# Patient Record
Sex: Female | Born: 1985 | Race: White | Hispanic: No | Marital: Single | Smoking: Current every day smoker
Health system: Southern US, Community
[De-identification: ages and names within clinical notes are randomized; demographics above are authoritative.]

## PROBLEM LIST (undated history)

## (undated) DIAGNOSIS — J189 Pneumonia, unspecified organism: Secondary | ICD-10-CM

## (undated) DIAGNOSIS — J40 Bronchitis, not specified as acute or chronic: Secondary | ICD-10-CM

## (undated) DIAGNOSIS — E039 Hypothyroidism, unspecified: Secondary | ICD-10-CM

## (undated) DIAGNOSIS — F1190 Opioid use, unspecified, uncomplicated: Secondary | ICD-10-CM

## (undated) DIAGNOSIS — G43909 Migraine, unspecified, not intractable, without status migrainosus: Secondary | ICD-10-CM

## (undated) DIAGNOSIS — F419 Anxiety disorder, unspecified: Secondary | ICD-10-CM

## (undated) HISTORY — PX: LAPAROSCOPIC ENDOMETRIOSIS FULGURATION: SUR769

## (undated) HISTORY — PX: KNEE ARTHROSCOPY: SUR90

## (undated) HISTORY — PX: OTHER SURGICAL HISTORY: SHX169

---

## 2011-06-14 ENCOUNTER — Other Ambulatory Visit: Payer: Self-pay | Admitting: Neurosurgery

## 2011-06-16 ENCOUNTER — Encounter (HOSPITAL_COMMUNITY): Payer: Self-pay | Admitting: Pharmacy Technician

## 2011-06-21 ENCOUNTER — Encounter (HOSPITAL_COMMUNITY): Payer: Self-pay

## 2011-06-21 NOTE — Progress Notes (Signed)
Pre admit call completed. Pt denied having a cardiac cath or sleep study but did inform Nurse that in 2005 at [facility] she had a stress test. Pt stated "they thought I was having a stroke and they ran all kinds of tests on me, but I wasn't." Will attempt to request results from [facility]

## 2011-06-21 NOTE — Pre-Procedure Instructions (Signed)
20 Megan Hopkins  06/21/2011   Your procedure is scheduled on:  Wednesday June 29, 2011.  Report to [facility] at 0800 AM.  Call this number if you have problems the morning of surgery: [phone]   Remember:   Do not eat food or drink:After Midnight.    Take these medicines the morning of surgery with A SIP OF WATER: Lorazepam (Ativan) if needed for anxiety, and Oxycodone if needed for pain.   Do not wear jewelry, make-up or nail polish.  Do not wear lotions, powders, or perfumes.   Do not shave 48 hours prior to surgery.   Do not bring valuables to the hospital.  Contacts, dentures or bridgework may not be worn into surgery.  Leave suitcase in the car. After surgery it may be brought to your room.  For patients admitted to the hospital, checkout time is 11:00 AM the day of discharge.   Patients discharged the day of surgery will not be allowed to drive home.  Name and phone number of your driver:   Special Instructions: CHG Shower Use Special Wash: 1/2 bottle night before surgery and 1/2 bottle morning of surgery.   Please read over the following fact sheets that you were given: Pain Booklet, Coughing and Deep Breathing, Blood Transfusion Information, MRSA Information and Surgical Site Infection Prevention

## 2011-06-24 ENCOUNTER — Encounter (HOSPITAL_COMMUNITY)
Admission: RE | Admit: 2011-06-24 | Discharge: 2011-06-24 | Disposition: A | Discharge status: Home / Self Care | Payer: 59 | Source: Ambulatory Visit | Attending: Neurosurgery | Admitting: Neurosurgery

## 2011-06-24 HISTORY — DX: Bronchitis, not specified as acute or chronic: J40

## 2011-06-24 HISTORY — DX: Migraine, unspecified, not intractable, without status migrainosus: G43.909

## 2011-06-24 HISTORY — DX: Anxiety disorder, unspecified: F41.9

## 2011-06-24 HISTORY — DX: Pneumonia, unspecified organism: J18.9

## 2011-06-24 LAB — CBC
HCT: 42.6 % (ref 36.0–46.0)
Hemoglobin: 14.4 g/dL (ref 12.0–15.0)
RDW: 14.1 % (ref 11.5–15.5)
WBC: 7.4 10*3/uL (ref 4.0–10.5)

## 2011-06-24 LAB — TYPE AND SCREEN
ABO/RH(D): O NEG
Antibody Screen: NEGATIVE

## 2011-06-24 LAB — ABO/RH: ABO/RH(D): O NEG

## 2011-06-24 LAB — SURGICAL PCR SCREEN: Staphylococcus aureus: NEGATIVE

## 2011-06-24 LAB — HCG, SERUM, QUALITATIVE: Preg, Serum: NEGATIVE

## 2011-06-28 MED ORDER — CEFAZOLIN SODIUM 1-5 GM-% IV SOLN
1.0000 g | INTRAVENOUS | Status: DC
  Filled 2011-06-28: qty 50

## 2011-06-29 ENCOUNTER — Inpatient Hospital Stay (HOSPITAL_COMMUNITY)
Admission: RE | Admit: 2011-06-29 | Discharge: 2011-07-03 | DRG: 460 | Disposition: A | Discharge status: Home / Self Care | Payer: 59 | Source: Ambulatory Visit | Attending: Neurosurgery | Admitting: Neurosurgery

## 2011-06-29 ENCOUNTER — Ambulatory Visit (HOSPITAL_COMMUNITY): Payer: 59

## 2011-06-29 ENCOUNTER — Encounter (HOSPITAL_COMMUNITY): Payer: Self-pay | Admitting: Anesthesiology

## 2011-06-29 ENCOUNTER — Encounter (HOSPITAL_COMMUNITY)
Admission: RE | Disposition: A | Discharge status: Home / Self Care | Payer: Self-pay | Source: Ambulatory Visit | Attending: Neurosurgery

## 2011-06-29 ENCOUNTER — Ambulatory Visit (HOSPITAL_COMMUNITY): Payer: 59 | Admitting: Anesthesiology

## 2011-06-29 DIAGNOSIS — F411 Generalized anxiety disorder: Secondary | ICD-10-CM | POA: Diagnosis present

## 2011-06-29 DIAGNOSIS — M5137 Other intervertebral disc degeneration, lumbosacral region: Secondary | ICD-10-CM | POA: Diagnosis present

## 2011-06-29 DIAGNOSIS — M4310 Spondylolisthesis, site unspecified: Secondary | ICD-10-CM | POA: Diagnosis present

## 2011-06-29 DIAGNOSIS — F17200 Nicotine dependence, unspecified, uncomplicated: Secondary | ICD-10-CM | POA: Diagnosis present

## 2011-06-29 DIAGNOSIS — M47817 Spondylosis without myelopathy or radiculopathy, lumbosacral region: Principal | ICD-10-CM | POA: Diagnosis present

## 2011-06-29 DIAGNOSIS — M51379 Other intervertebral disc degeneration, lumbosacral region without mention of lumbar back pain or lower extremity pain: Secondary | ICD-10-CM | POA: Diagnosis present

## 2011-06-29 DIAGNOSIS — Z981 Arthrodesis status: Secondary | ICD-10-CM

## 2011-06-29 SURGERY — POSTERIOR LUMBAR FUSION 1 LEVEL
Anesthesia: General | Wound class: Clean

## 2011-06-29 MED ORDER — CEFAZOLIN SODIUM-DEXTROSE 2-3 GM-% IV SOLR
2.0000 g | Freq: Three times a day (TID) | INTRAVENOUS | Status: AC
  Administered 2011-06-29 – 2011-06-30 (×2): 2 g via INTRAVENOUS
  Filled 2011-06-29 (×2): qty 50

## 2011-06-29 MED ORDER — SUFENTANIL CITRATE 50 MCG/ML IV SOLN
INTRAVENOUS | Status: DC | PRN
  Administered 2011-06-29 (×15): 10 ug via INTRAVENOUS

## 2011-06-29 MED ORDER — DIAZEPAM 5 MG PO TABS
5.0000 mg | ORAL_TABLET | Freq: Four times a day (QID) | ORAL | Status: DC | PRN
  Administered 2011-06-29 – 2011-07-03 (×14): 5 mg via ORAL
  Filled 2011-06-29 (×13): qty 1

## 2011-06-29 MED ORDER — ACETAMINOPHEN 650 MG RE SUPP: 650.0000 mg | RECTAL | Status: DC | PRN

## 2011-06-29 MED ORDER — ONDANSETRON HCL 4 MG/2ML IJ SOLN
INTRAMUSCULAR | Status: DC | PRN
  Administered 2011-06-29: 4 mg via INTRAVENOUS

## 2011-06-29 MED ORDER — LIDOCAINE HCL 4 % MT SOLN
OROMUCOSAL | Status: DC | PRN
  Administered 2011-06-29: 4 mL via TOPICAL

## 2011-06-29 MED ORDER — 0.9 % SODIUM CHLORIDE (POUR BTL) OPTIME
TOPICAL | Status: DC | PRN
  Administered 2011-06-29: 1000 mL

## 2011-06-29 MED ORDER — ROCURONIUM BROMIDE 100 MG/10ML IV SOLN
INTRAVENOUS | Status: DC | PRN
  Administered 2011-06-29: 10 mg via INTRAVENOUS
  Administered 2011-06-29: 50 mg via INTRAVENOUS
  Administered 2011-06-29 (×3): 10 mg via INTRAVENOUS

## 2011-06-29 MED ORDER — DIPHENHYDRAMINE HCL 50 MG/ML IJ SOLN: 12.5000 mg | Freq: Four times a day (QID) | INTRAMUSCULAR | Status: DC | PRN

## 2011-06-29 MED ORDER — ONDANSETRON HCL 4 MG/2ML IJ SOLN: 4.0000 mg | Freq: Four times a day (QID) | INTRAMUSCULAR | Status: DC | PRN

## 2011-06-29 MED ORDER — NEOSTIGMINE METHYLSULFATE 1 MG/ML IJ SOLN
INTRAMUSCULAR | Status: DC | PRN
  Administered 2011-06-29: 3 mg via INTRAVENOUS

## 2011-06-29 MED ORDER — DIAZEPAM 5 MG PO TABS
ORAL_TABLET | ORAL | Status: AC
  Filled 2011-06-29: qty 1

## 2011-06-29 MED ORDER — HYDROMORPHONE HCL PF 1 MG/ML IJ SOLN
INTRAMUSCULAR | Status: AC
  Filled 2011-06-29: qty 1

## 2011-06-29 MED ORDER — ACETAMINOPHEN 10 MG/ML IV SOLN
INTRAVENOUS | Status: AC
  Filled 2011-06-29: qty 100

## 2011-06-29 MED ORDER — MENTHOL 3 MG MT LOZG
1.0000 | LOZENGE | OROMUCOSAL | Status: DC | PRN
  Administered 2011-06-29: 3 mg via ORAL
  Filled 2011-06-29: qty 9

## 2011-06-29 MED ORDER — SODIUM CHLORIDE 0.9 % IV SOLN
INTRAVENOUS | Status: AC
  Filled 2011-06-29: qty 500

## 2011-06-29 MED ORDER — BACITRACIN 50000 UNITS IM SOLR
INTRAMUSCULAR | Status: AC
  Filled 2011-06-29: qty 1

## 2011-06-29 MED ORDER — SODIUM CHLORIDE 0.9 % IR SOLN
Status: DC | PRN
  Administered 2011-06-29: 12:00:00

## 2011-06-29 MED ORDER — HEMOSTATIC AGENTS (NO CHARGE) OPTIME
TOPICAL | Status: DC | PRN
  Administered 2011-06-29: 1 via TOPICAL

## 2011-06-29 MED ORDER — ACETAMINOPHEN 10 MG/ML IV SOLN
INTRAVENOUS | Status: DC | PRN
  Administered 2011-06-29: 1000 mg via INTRAVENOUS

## 2011-06-29 MED ORDER — GLYCOPYRROLATE 0.2 MG/ML IJ SOLN
INTRAMUSCULAR | Status: DC | PRN
  Administered 2011-06-29: 0.4 mg via INTRAVENOUS

## 2011-06-29 MED ORDER — DEXAMETHASONE SODIUM PHOSPHATE 4 MG/ML IJ SOLN
INTRAMUSCULAR | Status: DC | PRN
  Administered 2011-06-29: 4 mg via INTRAVENOUS

## 2011-06-29 MED ORDER — DIPHENHYDRAMINE HCL 12.5 MG/5ML PO ELIX: 12.5000 mg | ORAL_SOLUTION | Freq: Four times a day (QID) | ORAL | Status: DC | PRN

## 2011-06-29 MED ORDER — NALOXONE HCL 0.4 MG/ML IJ SOLN: 0.4000 mg | INTRAMUSCULAR | Status: DC | PRN

## 2011-06-29 MED ORDER — PHENOL 1.4 % MT LIQD: 1.0000 | OROMUCOSAL | Status: DC | PRN

## 2011-06-29 MED ORDER — MORPHINE SULFATE (PF) 1 MG/ML IV SOLN
INTRAVENOUS | Status: DC
  Administered 2011-06-29 – 2011-06-30 (×5): via INTRAVENOUS
  Administered 2011-06-30: 51.79 mg via INTRAVENOUS
  Administered 2011-06-30: 17:00:00 via INTRAVENOUS
  Administered 2011-06-30: 24.99 mg via INTRAVENOUS
  Administered 2011-06-30: 02:00:00 via INTRAVENOUS
  Administered 2011-06-30: 25 mg via INTRAVENOUS
  Administered 2011-06-30 – 2011-07-01 (×3): via INTRAVENOUS
  Administered 2011-07-01: 25 mg via INTRAVENOUS
  Administered 2011-07-01: 25.03 mg via INTRAVENOUS
  Administered 2011-07-01: 21:00:00 via INTRAVENOUS
  Administered 2011-07-01: 21.28 mg via INTRAVENOUS
  Administered 2011-07-01: 18 mg via INTRAVENOUS
  Administered 2011-07-01: 27.96 mg via INTRAVENOUS
  Administered 2011-07-02: 14.91 mg via INTRAVENOUS
  Administered 2011-07-02: 18.68 mg via INTRAVENOUS
  Administered 2011-07-02: 10.5 mg via INTRAVENOUS
  Administered 2011-07-02: 01:00:00 via INTRAVENOUS
  Administered 2011-07-03: 1.5 mg via INTRAVENOUS
  Administered 2011-07-03 (×2): 3 mg via INTRAVENOUS
  Filled 2011-06-29 (×17): qty 25

## 2011-06-29 MED ORDER — THROMBIN 20000 UNITS EX KIT
PACK | CUTANEOUS | Status: DC | PRN
  Administered 2011-06-29: 20000 [IU] via TOPICAL

## 2011-06-29 MED ORDER — OXYCODONE HCL 5 MG PO TABS: 30.0000 mg | ORAL_TABLET | ORAL | Status: DC | PRN

## 2011-06-29 MED ORDER — MORPHINE SULFATE (PF) 1 MG/ML IV SOLN
INTRAVENOUS | Status: AC
  Filled 2011-06-29: qty 25

## 2011-06-29 MED ORDER — PROMETHAZINE HCL 25 MG/ML IJ SOLN: 6.2500 mg | INTRAMUSCULAR | Status: DC | PRN

## 2011-06-29 MED ORDER — OXYCODONE HCL 5 MG PO TABS
15.0000 mg | ORAL_TABLET | ORAL | Status: DC | PRN
  Administered 2011-06-29 (×2): 15 mg via ORAL
  Administered 2011-06-30: 20 mg via ORAL
  Administered 2011-06-30 (×3): 15 mg via ORAL
  Administered 2011-07-01: 20 mg via ORAL
  Administered 2011-07-01 (×2): 30 mg via ORAL
  Administered 2011-07-01: 20 mg via ORAL
  Administered 2011-07-01 – 2011-07-02 (×3): 30 mg via ORAL
  Administered 2011-07-02: 20 mg via ORAL
  Administered 2011-07-02 – 2011-07-03 (×4): 30 mg via ORAL
  Filled 2011-06-29: qty 6
  Filled 2011-06-29: qty 4
  Filled 2011-06-29: qty 3
  Filled 2011-06-29: qty 6
  Filled 2011-06-29: qty 3
  Filled 2011-06-29: qty 6
  Filled 2011-06-29: qty 2
  Filled 2011-06-29 (×2): qty 3
  Filled 2011-06-29: qty 4
  Filled 2011-06-29 (×2): qty 6
  Filled 2011-06-29: qty 3
  Filled 2011-06-29: qty 6
  Filled 2011-06-29: qty 3
  Filled 2011-06-29 (×2): qty 6
  Filled 2011-06-29 (×2): qty 3
  Filled 2011-06-29: qty 6
  Filled 2011-06-29: qty 3

## 2011-06-29 MED ORDER — CEFAZOLIN SODIUM 1-5 GM-% IV SOLN
INTRAVENOUS | Status: DC | PRN
  Administered 2011-06-29: 1 g via INTRAVENOUS

## 2011-06-29 MED ORDER — LACTATED RINGERS IV SOLN
INTRAVENOUS | Status: DC | PRN
  Administered 2011-06-29 (×3): via INTRAVENOUS

## 2011-06-29 MED ORDER — MEPERIDINE HCL 25 MG/ML IJ SOLN: 6.2500 mg | INTRAMUSCULAR | Status: DC | PRN

## 2011-06-29 MED ORDER — HYDROMORPHONE HCL PF 1 MG/ML IJ SOLN
0.2500 mg | INTRAMUSCULAR | Status: DC | PRN
  Administered 2011-06-29 (×4): 0.5 mg via INTRAVENOUS

## 2011-06-29 MED ORDER — LACTATED RINGERS IV SOLN
INTRAVENOUS | Status: DC
  Administered 2011-06-29 – 2011-07-01 (×4): via INTRAVENOUS

## 2011-06-29 MED ORDER — DIPHENHYDRAMINE HCL 25 MG PO TABS
25.0000 mg | ORAL_TABLET | Freq: Every evening | ORAL | Status: DC | PRN
  Filled 2011-06-29: qty 1

## 2011-06-29 MED ORDER — BUPIVACAINE LIPOSOME 1.3 % IJ SUSP
20.0000 mL | Freq: Once | INTRAMUSCULAR | Status: AC
  Administered 2011-06-29: 20 mL
  Filled 2011-06-29: qty 20

## 2011-06-29 MED ORDER — BUPIVACAINE-EPINEPHRINE PF 0.5-1:200000 % IJ SOLN
INTRAMUSCULAR | Status: DC | PRN
  Administered 2011-06-29: 10 mL

## 2011-06-29 MED ORDER — LIDOCAINE HCL (CARDIAC) 20 MG/ML IV SOLN
INTRAVENOUS | Status: DC | PRN
  Administered 2011-06-29: 90 mg via INTRAVENOUS

## 2011-06-29 MED ORDER — SODIUM CHLORIDE 0.9 % IJ SOLN: 9.0000 mL | INTRAMUSCULAR | Status: DC | PRN

## 2011-06-29 MED ORDER — DOCUSATE SODIUM 100 MG PO CAPS
100.0000 mg | ORAL_CAPSULE | Freq: Two times a day (BID) | ORAL | Status: DC
  Administered 2011-06-29 – 2011-07-03 (×7): 100 mg via ORAL
  Filled 2011-06-29 (×6): qty 1

## 2011-06-29 MED ORDER — HETASTARCH-ELECTROLYTES 6 % IV SOLN
INTRAVENOUS | Status: DC | PRN
  Administered 2011-06-29: 13:00:00 via INTRAVENOUS

## 2011-06-29 MED ORDER — ZOLPIDEM TARTRATE 10 MG PO TABS: 10.0000 mg | ORAL_TABLET | Freq: Every evening | ORAL | Status: DC | PRN

## 2011-06-29 MED ORDER — PROPOFOL 10 MG/ML IV EMUL
INTRAVENOUS | Status: DC | PRN
  Administered 2011-06-29: 200 mg via INTRAVENOUS

## 2011-06-29 MED ORDER — MIDAZOLAM HCL 5 MG/5ML IJ SOLN
INTRAMUSCULAR | Status: DC | PRN
  Administered 2011-06-29: 2 mg via INTRAVENOUS

## 2011-06-29 MED ORDER — ACETAMINOPHEN 325 MG PO TABS: 650.0000 mg | ORAL_TABLET | ORAL | Status: DC | PRN

## 2011-06-29 MED ORDER — BACITRACIN ZINC 500 UNIT/GM EX OINT
TOPICAL_OINTMENT | CUTANEOUS | Status: DC | PRN
  Administered 2011-06-29: 1 via TOPICAL

## 2011-06-29 MED ORDER — ONDANSETRON HCL 4 MG/2ML IJ SOLN: 4.0000 mg | INTRAMUSCULAR | Status: DC | PRN

## 2011-06-29 SURGICAL SUPPLY — 72 items
BAG DECANTER FOR FLEXI CONT (MISCELLANEOUS) ×2 IMPLANT
BENZOIN TINCTURE PRP APPL 2/3 (GAUZE/BANDAGES/DRESSINGS) ×2 IMPLANT
BLADE SURG ROTATE 9660 (MISCELLANEOUS) IMPLANT
BRUSH SCRUB EZ PLAIN DRY (MISCELLANEOUS) ×2 IMPLANT
BUR ACORN 6.0 (BURR) ×2 IMPLANT
BUR MATCHSTICK NEURO 3.0 LAGG (BURR) ×2 IMPLANT
CANISTER SUCTION 2500CC (MISCELLANEOUS) ×2 IMPLANT
CAP REVERE LOCKING (Cap) ×8 IMPLANT
CLOTH BEACON ORANGE TIMEOUT ST (SAFETY) ×2 IMPLANT
CONT SPEC 4OZ CLIKSEAL STRL BL (MISCELLANEOUS) ×4 IMPLANT
COVER BACK TABLE 24X17X13 BIG (DRAPES) IMPLANT
COVER TABLE BACK 60X90 (DRAPES) ×2 IMPLANT
DRAPE C-ARM 42X72 X-RAY (DRAPES) ×4 IMPLANT
DRAPE LAPAROTOMY 100X72X124 (DRAPES) ×2 IMPLANT
DRAPE POUCH INSTRU U-SHP 10X18 (DRAPES) ×2 IMPLANT
DRAPE PROXIMA HALF (DRAPES) ×2 IMPLANT
DRAPE SURG 17X23 STRL (DRAPES) ×8 IMPLANT
ELECT BLADE 4.0 EZ CLEAN MEGAD (MISCELLANEOUS) ×2
ELECT REM PT RETURN 9FT ADLT (ELECTROSURGICAL) ×2
ELECTRODE BLDE 4.0 EZ CLN MEGD (MISCELLANEOUS) ×1 IMPLANT
ELECTRODE REM PT RTRN 9FT ADLT (ELECTROSURGICAL) ×1 IMPLANT
GAUZE SPONGE 4X4 16PLY XRAY LF (GAUZE/BANDAGES/DRESSINGS) ×2 IMPLANT
GLOVE BIO SURGEON STRL SZ8.5 (GLOVE) ×4 IMPLANT
GLOVE BIOGEL PI IND STRL 7.0 (GLOVE) ×3 IMPLANT
GLOVE BIOGEL PI IND STRL 8 (GLOVE) ×1 IMPLANT
GLOVE BIOGEL PI INDICATOR 7.0 (GLOVE) ×3
GLOVE BIOGEL PI INDICATOR 8 (GLOVE) ×1
GLOVE ECLIPSE 7.5 STRL STRAW (GLOVE) ×2 IMPLANT
GLOVE EXAM NITRILE LRG STRL (GLOVE) IMPLANT
GLOVE EXAM NITRILE MD LF STRL (GLOVE) IMPLANT
GLOVE EXAM NITRILE XL STR (GLOVE) IMPLANT
GLOVE EXAM NITRILE XS STR PU (GLOVE) IMPLANT
GLOVE SS BIOGEL STRL SZ 7 (GLOVE) ×1 IMPLANT
GLOVE SS BIOGEL STRL SZ 8 (GLOVE) ×2 IMPLANT
GLOVE SUPERSENSE BIOGEL SZ 7 (GLOVE) ×1
GLOVE SUPERSENSE BIOGEL SZ 8 (GLOVE) ×2
GLOVE SURG SS PI 7.0 STRL IVOR (GLOVE) ×6 IMPLANT
GOWN BRE IMP SLV AUR LG STRL (GOWN DISPOSABLE) ×2 IMPLANT
GOWN BRE IMP SLV AUR XL STRL (GOWN DISPOSABLE) ×10 IMPLANT
GOWN STRL REIN 2XL LVL4 (GOWN DISPOSABLE) IMPLANT
KIT BASIN OR (CUSTOM PROCEDURE TRAY) ×2 IMPLANT
KIT ROOM TURNOVER OR (KITS) ×2 IMPLANT
MILL MEDIUM DISP (BLADE) ×2 IMPLANT
NEEDLE HYPO 21X1.5 SAFETY (NEEDLE) ×2 IMPLANT
NEEDLE HYPO 22GX1.5 SAFETY (NEEDLE) ×2 IMPLANT
NS IRRIG 1000ML POUR BTL (IV SOLUTION) ×2 IMPLANT
PACK FOAM VITOSS 10CC (Orthopedic Implant) ×2 IMPLANT
PACK LAMINECTOMY NEURO (CUSTOM PROCEDURE TRAY) ×2 IMPLANT
PAD ARMBOARD 7.5X6 YLW CONV (MISCELLANEOUS) ×6 IMPLANT
PATTIES SURGICAL .5 X1 (DISPOSABLE) IMPLANT
PUTTY 10ML ACTIFUSE ABX (Putty) ×2 IMPLANT
ROD REVERE 6.35 40MM (Rod) ×2 IMPLANT
ROD REVERE 6.35 45MM (Rod) ×2 IMPLANT
SCREW 7.5X45MM (Screw) ×4 IMPLANT
SCREW REVERE 6.35 7.5X40 (Screw) ×4 IMPLANT
SLEEVE SURGEON STRL (DRAPES) ×2 IMPLANT
SPACER SUSTAIN O 10X26 8MM (Spacer) ×4 IMPLANT
SPONGE GAUZE 4X4 12PLY (GAUZE/BANDAGES/DRESSINGS) IMPLANT
SPONGE LAP 4X18 X RAY DECT (DISPOSABLE) IMPLANT
SPONGE NEURO XRAY DETECT 1X3 (DISPOSABLE) IMPLANT
SPONGE SURGIFOAM ABS GEL 100 (HEMOSTASIS) ×2 IMPLANT
STRIP CLOSURE SKIN 1/2X4 (GAUZE/BANDAGES/DRESSINGS) IMPLANT
SUT VIC AB 1 CT1 18XBRD ANBCTR (SUTURE) ×2 IMPLANT
SUT VIC AB 1 CT1 8-18 (SUTURE) ×2
SUT VIC AB 2-0 CP2 18 (SUTURE) ×4 IMPLANT
SYR 20CC LL (SYRINGE) ×2 IMPLANT
SYR 20ML ECCENTRIC (SYRINGE) ×2 IMPLANT
TAPE CLOTH SURG 4X10 WHT LF (GAUZE/BANDAGES/DRESSINGS) ×2 IMPLANT
TOWEL OR 17X24 6PK STRL BLUE (TOWEL DISPOSABLE) ×2 IMPLANT
TOWEL OR 17X26 10 PK STRL BLUE (TOWEL DISPOSABLE) ×2 IMPLANT
TRAY FOLEY CATH 14FRSI W/METER (CATHETERS) ×2 IMPLANT
WATER STERILE IRR 1000ML POUR (IV SOLUTION) ×2 IMPLANT

## 2011-06-29 NOTE — Op Note (Signed)
Brief history: The patient is a 26 year old white female who is complaining of chronic back and leg pain consistent with a lumbar radiculopathy. She has failed medical management and workup with a lumbar MRI and lumbar x-rays. This demonstrated the patient had L5 spondylolisthesis with L5-S1 spinal stasis and spinal stenosis. I discussed the various treatment option including surgery. The patient has weighed the risks benefits and alternatives surgery decided proceed with the operation.  Preoperative diagnosis: L5-S1 spondylolisthesis, spondylolysis, Degenerative disc disease, spinal stenosis/bilateral neuroforaminal stenosis affecting the L5 and S1 nerve roots; lumbago; lumbar radiculopathy  Postoperative diagnosis: L5-S1 spondylolisthesis, spondylolysis, Degenerative disc disease, spinal stenosis; lumbago; lumbar radiculopathy  Procedure: Complete L5 laminectomy with bilateral foraminotomies to decompress the bilateral L5 and S1 nerve roots(the work required to do this was in addition to the work required to do the posterior lumbar interbody fusion because of the patient's spinal stenosis, facet arthropathy. Etc. requiring a wide decompression of the nerve roots.); L5-S1 posterior lumbar interbody fusion with local morselized autograft bone and Actifusebone graft extender; insertion of interbody prosthesis at L5-S1 (globus peek interbody prosthesis); posterior nonsegmental instrumentation from L5 to S1 with globus titanium pedicle screws and rods; posterior lateral arthrodesis at L5-S1 with local morselized autograft bone and Vitoss bone graft extender.  Surgeon: Dr. Delma Officer  Asst.: Dr. Shirlean Kelly  Anesthesia: Gen. endotracheal  Estimated blood loss: 200 cc  Drains: None  Locations: None  Description of procedure: The patient was brought to the operating room by the anesthesia team. General endotracheal anesthesia was induced. The patient was turned to the prone position on the Wilson  frame. The patient's lumbosacral region was then prepared with Betadine scrub and Betadine solution. Sterile drapes were applied.  I then injected the area to be incised with Marcaine with epinephrine solution. I then used the scalpel to make a linear midline incision over the L5-S1 interspace. I then used electrocautery to perform a bilateral subperiosteal dissection exposing the spinous process and lamina of L5 and the upper sacrum. We then obtained intraoperative radiograph to confirm our location. We then inserted the Verstrac retractor to provide exposure.  I began the decompression by incising the interspinous ligament at L4-5 and L5-S1 with a scalpel. I then used Leksell were unsure to remove the lamina at L5 as well as a mild lateral L5 superior facets. We then used the Kerrison punches to widen the laminotomy and removed the ligamentum flavum at L4-5 and L5-S1. We used the Kerrison punches to remove the medial facets at 5 S1. We performed wide foraminotomies about the bilateral L5 and S1 nerve roots completing the decompression.  We now turned our attention to the posterior lumbar interbody fusion. I used a scalpel to incise the intervertebral disc at L5-S1. I then performed a partial intervertebral discectomy at L5-S1 using the pituitary forceps. We prepared the vertebral endplates at 5 S1 for the fusion by removing the soft tissues with the curettes. We then used the trial spacers to pick the appropriate sized interbody prosthesis. We prefilled his prosthesis with a combination of local morselized autograft bone that we obtained during the decompression as well as Actifuse bone graft extender. We inserted the prefilled prosthesis into the interspace at L5-S1. There was a good snug fit of the prosthesis in the interspace. We then filled and the remainder of the intervertebral disc space with local morselized autograft bone and Actifuse. This completed the posterior lumbar interbody arthrodesis.  We  now turned attention to the instrumentation. Under fluoroscopic guidance  we cannulated the bilateral L5 and S1 pedicles with the bone probe. We then removed the bone probe. He then tapped the pedicle with a 6.5 millimeter tap. We then removed the tap. We probed inside the tapped pedicle with a ball probe to rule out cortical breaches. We then inserted a 6.5 x 45 and 40 millimeter pedicle screw into the L5 and S1 pedicles bilaterally under fluoroscopic guidance. We then palpated along the medial aspect of the pedicles to rule out cortical breaches. There were none. The nerve roots were not injured. We then connected the unilateral pedicle screws with a lordotic rod. We compressed the construct and secured the rod in place with the caps. We then tightened the caps appropriately. This completed the instrumentation from L5-S1.  We now turned our attention to the posterior lateral arthrodesis at L5-S1. We used the high-speed drill to decorticate the remainder of the facets, pars, transverse process at L5 and S1. We then applied a combination of local morselized autograft bone and Vitoss bone graft extender over these decorticated posterior lateral structures. This completed the posterior lateral arthrodesis.  We then obtained hemostasis using bipolar electrocautery. We irrigated the wound out with bacitracin solution. We inspected the thecal sac and nerve roots and noted they were well decompressed. We then removed the retractor. We reapproximated patient's thoracolumbar fascia with interrupted #1 Vicryl suture. We reapproximated patient's subcutaneous tissue with interrupted 2-0 Vicryl suture. The reapproximated patient's skin with Steri-Strips and benzoin. The wound was then coated with bacitracin ointment. A sterile dressing was applied. The drapes were removed. The patient was subsequently returned to the supine position where they were extubated by the anesthesia team. He was then transported to the post  anesthesia care unit in stable condition. All sponge instrument and needle counts were correct at the end of this case.

## 2011-06-29 NOTE — Preoperative (Signed)
Beta Blockers   Reason not to administer Beta Blockers:Not Applicable 

## 2011-06-29 NOTE — Progress Notes (Signed)
Morphine PCA used 22.45 before new syringe placed.

## 2011-06-29 NOTE — Progress Notes (Signed)
Patient ID: Megan Hopkins, female   DOB: 01/13/1985, 26 y.o.   MRN: [id number] Subjective:  The patient is alert. Her back is sore.  Objective: Vital signs in last 24 hours: Temp:  [98.1 F (36.7 C)] 98.1 F (36.7 C) (06/19 0815) Pulse Rate:  [104] 104  (06/19 0815) Resp:  [18] 18  (06/19 0815) BP: (107)/(69) 107/69 mmHg (06/19 0815) SpO2:  [99 %] 99 % (06/19 0815)  Intake/Output from previous day:   Intake/Output this shift: Total I/O In: 3000 [I.V.:2500; IV Piggyback:500] Out: 550 [Urine:400; Blood:150]  Physical exam the patient is moving all 4 extremities well. Her dressing is clean and dry.  Lab Results: No results found for this basename: WBC:2,HGB:2,HCT:2,PLT:2 in the last 72 hours BMET No results found for this basename: NA:2,K:2,CL:2,CO2:2,GLUCOSE:2,BUN:2,CREATININE:2,CALCIUM:2 in the last 72 hours  Studies/Results: Dg Lumbar Spine 1 View  06/29/2011  *RADIOLOGY REPORT*  Clinical Data: 26 year old female undergoing lumbar spine surgery.  LUMBAR SPINE - 1 VIEW  Comparison: None.  Findings: Portable cross-table lateral intraoperative view of the lumbar spine labeled film #[id number] hours.  Normal lumbar segmentation presumed.  Anterolisthesis of L5 on S1. Surgical probe directed at the L5 pedicle level.  IMPRESSION: Intraoperative localization at L5 assuming normal lumbar segmentation.  Original Report Authenticated By: [name], M.D.    Assessment/Plan: Patient is doing well.  LOS: 0 days     Vane Yapp D 06/29/2011, 3:03 PM

## 2011-06-29 NOTE — Transfer of Care (Signed)
Immediate Anesthesia Transfer of Care Note  Patient: Megan Hopkins  Procedure(s) Performed: Procedure(s) (LRB): POSTERIOR LUMBAR FUSION 1 LEVEL (N/A)  Patient Location: PACU  Anesthesia Type: General  Level of Consciousness: awake, alert  and patient cooperative  Airway & Oxygen Therapy: Patient Spontanous Breathing and Patient connected to nasal cannula oxygen  Post-op Assessment: Report given to PACU RN and Post -op Vital signs reviewed and stable  Post vital signs: Reviewed  Complications: No apparent anesthesia complications

## 2011-06-29 NOTE — Anesthesia Postprocedure Evaluation (Signed)
  Anesthesia Post-op Note  Patient: Megan Hopkins  Procedure(s) Performed: Procedure(s) (LRB): POSTERIOR LUMBAR FUSION 1 LEVEL (N/A)  Patient Location: PACU  Anesthesia Type: General  Level of Consciousness: awake  Airway and Oxygen Therapy: Patient Spontanous Breathing  Post-op Pain: moderate  Post-op Assessment: Post-op Vital signs reviewed  Post-op Vital Signs: stable  Complications: No apparent anesthesia complications

## 2011-06-29 NOTE — H&P (Signed)
Subjective: The patient is a 26 year old white female who's had chronic back and leg pain consistent with a lumbar radiculopathy. She has failed medical management and was worked up with a lumbar MRI. This demonstrated patient has spondylolisthesis and foraminal stenosis at L5-S1. I discussed the various treatment options with the patient including surgery. The patient has weighed the risks, benefits, and alternatives surgery decided proceed with at L5-S1 decompression instrumentation and fusion.   Past Medical History  Diagnosis Date  . Pneumonia   . Bronchitis     hx of  . Anxiety   . Migraine     hx of    Past Surgical History  Procedure Date  . Cervical colon biospy   . Knee arthroscopy     left  . Laparoscopic endometriosis fulguration     Allergies  Allergen Reactions  . Other Anaphylaxis    Horseradish.  . Bee Venom Hives and Swelling  . Doxycycline Hives and Nausea And Vomiting  . Calamine Hives    History  Substance Use Topics  . Smoking status: Current Everyday Smoker -- 0.2 packs/day for 8 years  . Smokeless tobacco: Not on file  . Alcohol Use: Yes     occassional    No family history on file. Prior to Admission medications   Medication Sig Start Date End Date Taking? Authorizing Provider  diphenhydrAMINE (BENADRYL) 25 MG tablet Take 25 mg by mouth at bedtime as needed. For sleep.   Yes Historical Provider, MD  LORazepam (ATIVAN) 0.5 MG tablet Take 0.5 mg by mouth as needed.   Yes Historical Provider, MD  oxyCODONE (ROXICODONE) 15 MG immediate release tablet Take 15 mg by mouth every 4 (four) hours as needed. For back pain.   Yes Historical Provider, MD     Review of Systems  Positive ROS: As above  All other systems have been reviewed and were otherwise negative with the exception of those mentioned in the HPI and as above.  Objective: Vital signs in last 24 hours: Temp:  [98.1 F (36.7 C)] 98.1 F (36.7 C) (06/19 0815) Pulse Rate:  [104] 104  (06/19  0815) Resp:  [18] 18  (06/19 0815) BP: (107)/(69) 107/69 mmHg (06/19 0815) SpO2:  [99 %] 99 % (06/19 0815)  General Appearance: Alert, cooperative, no distress, appears stated age Head: Normocephalic, without obvious abnormality, atraumatic Eyes: PERRL, conjunctiva/corneas clear, EOM's intact, fundi benign, both eyes      Ears: Normal TM's and external ear canals, both ears Throat: Lips, mucosa, and tongue normal; teeth and gums normal Neck: Supple, symmetrical, trachea midline, no adenopathy; thyroid: No enlargement/tenderness/nodules; no carotid bruit or JVD Back: Symmetric, no curvature, ROM normal, no CVA tenderness Lungs: Clear to auscultation bilaterally, respirations unlabored Heart: Regular rate and rhythm, S1 and S2 normal, no murmur, rub or gallop Abdomen: Soft, non-tender, bowel sounds active all four quadrants, no masses, no organomegaly Extremities: Extremities normal, atraumatic, no cyanosis or edema Pulses: 2+ and symmetric all extremities Skin: Skin color, texture, turgor normal, no rashes or lesions  NEUROLOGIC:   Mental status: alert and oriented, no aphasia, good attention span, Fund of knowledge/ memory ok Motor Exam - grossly normal Sensory Exam - grossly normal Reflexes: Symmetric Coordination - grossly normal Gait - grossly normal Balance - grossly normal Cranial Nerves: I: smell Not tested  II: visual acuity  OS: Normal    OD: Normal   II: visual fields Full to confrontation  II: pupils Equal, round, reactive to light  III,VII: ptosis None  III,IV,VI: extraocular muscles  Full ROM  V: mastication Normal  V: facial light touch sensation  Normal  V,VII: corneal reflex  Present  VII: facial muscle function - upper  Normal  VII: facial muscle function - lower Normal  VIII: hearing Not tested  IX: soft palate elevation  Normal  IX,X: gag reflex Present  XI: trapezius strength  5/5  XI: sternocleidomastoid strength 5/5  XI: neck flexion strength  5/5    XII: tongue strength  Normal    Data Review Lab Results  Component Value Date   WBC 7.4 06/24/2011   HGB 14.4 06/24/2011   HCT 42.6 06/24/2011   MCV 86.6 06/24/2011   PLT 198 06/24/2011   No results found for this basename: NA, K, CL, CO2, BUN, creatinine, glucose   No results found for this basename: INR, PROTIME    Assessment/Plan: L5-S1 spondylolisthesis, spondylolysis, lumbago, lumbar radiculopathy, stenosis: I discussed situation with the patient. I reviewed her MR scan with her and pointed out the abnormalities. We have discussed the various treatment options including a L5-S1 decompression instrumentation and fusion. I've shown her surgical models. We have discussed the risks, benefits, alternatives and likelihood of achieving our goals with surgery. I've answered all her questions. She has decided proceed with the operation.   Jabreel Chimento D 06/29/2011 10:26 AM

## 2011-06-29 NOTE — Anesthesia Preprocedure Evaluation (Addendum)
Anesthesia Evaluation  Patient identified by MRN, date of birth, ID band Patient awake    Reviewed: Allergy & Precautions, H&P , NPO status , Patient's Chart, lab work & pertinent test results  History of Anesthesia Complications Negative for: history of anesthetic complications  Airway Mallampati: I TM Distance: >3 FB Neck ROM: Full    Dental  (+) Teeth Intact and Dental Advisory Given   Pulmonary neg pulmonary ROS, Current Smoker,  breath sounds clear to auscultation        Cardiovascular negative cardio ROS  Rhythm:Regular Rate:Normal     Neuro/Psych Anxiety    GI/Hepatic negative GI ROS, Neg liver ROS,   Endo/Other  negative endocrine ROS  Renal/GU negative Renal ROS     Musculoskeletal   Abdominal   Peds  Hematology negative hematology ROS (+)   Anesthesia Other Findings   Reproductive/Obstetrics                          Anesthesia Physical Anesthesia Plan  ASA: I  Anesthesia Plan: General   Post-op Pain Management:    Induction: Intravenous  Airway Management Planned: Oral ETT  Additional Equipment:   Intra-op Plan:   Post-operative Plan: Extubation in OR  Informed Consent: I have reviewed the patients History and Physical, chart, labs and discussed the procedure including the risks, benefits and alternatives for the proposed anesthesia with the patient or authorized representative who has indicated his/her understanding and acceptance.   Dental advisory given  Plan Discussed with: CRNA and Surgeon  Anesthesia Plan Comments:         Anesthesia Quick Evaluation

## 2011-06-30 ENCOUNTER — Encounter (HOSPITAL_COMMUNITY): Payer: Self-pay | Admitting: *Deleted

## 2011-06-30 LAB — BASIC METABOLIC PANEL
BUN: 8 mg/dL (ref 6–23)
Chloride: 106 mEq/L (ref 96–112)
Creatinine, Ser: 0.79 mg/dL (ref 0.50–1.10)
Glucose, Bld: 96 mg/dL (ref 70–99)
Potassium: 3.5 mEq/L (ref 3.5–5.1)

## 2011-06-30 LAB — CBC
HCT: 36.4 % (ref 36.0–46.0)
Hemoglobin: 12.2 g/dL (ref 12.0–15.0)
MCV: 85.2 fL (ref 78.0–100.0)
WBC: 12.9 10*3/uL — ABNORMAL HIGH (ref 4.0–10.5)

## 2011-06-30 MED ORDER — ACETAMINOPHEN 10 MG/ML IV SOLN
1000.0000 mg | Freq: Four times a day (QID) | INTRAVENOUS | Status: AC
  Administered 2011-06-30 – 2011-07-01 (×3): 1000 mg via INTRAVENOUS
  Filled 2011-06-30 (×4): qty 100

## 2011-06-30 MED ORDER — KETOROLAC TROMETHAMINE 30 MG/ML IJ SOLN
30.0000 mg | Freq: Four times a day (QID) | INTRAMUSCULAR | Status: DC | PRN
  Administered 2011-07-01 – 2011-07-03 (×9): 30 mg via INTRAVENOUS
  Filled 2011-06-30 (×9): qty 1

## 2011-06-30 NOTE — Evaluation (Signed)
Physical Therapy Evaluation Patient Details Name: Megan Hopkins MRN: [id number] DOB: 1985/04/11 Today's Date: 06/30/2011 Time: 4098-1191 PT Time Calculation (min): 16 min  PT Assessment / Plan / Recommendation Clinical Impression  Pt s/p PLIF L5-S1. Pt in excrutiating 10/10 pain with all mobility limiting progress. Pt will benefit from skilled PT in the acute care setting in order to maximize functional mobility.    PT Assessment  Patient needs continued PT services    Follow Up Recommendations  Home health PT;Supervision/Assistance - 24 hour    Barriers to Discharge        lEquipment Recommendations  Rolling walker with 5" wheels    Recommendations for Other Services     Frequency Min 5X/week    Precautions / Restrictions Precautions Precautions: Back Precaution Booklet Issued: Yes (comment) Precaution Comments: pt educated on 3/3 back precautions Required Braces or Orthoses: Spinal Brace Spinal Brace: Applied in sitting position;Lumbar corset Restrictions Weight Bearing Restrictions: No         Mobility  Bed Mobility Bed Mobility: Rolling Left;Left Sidelying to Sit;Sit to Sidelying Left;Sitting - Scoot to Edge of Bed Rolling Left: 4: Min assist;With rail Left Sidelying to Sit: 3: Mod assist;With rails Sitting - Scoot to Edge of Bed: 5: Supervision Sit to Sidelying Left: 4: Min assist;With rail Details for Bed Mobility Assistance: VC for proper sequencing to maintain back precautions during transfers. Pt extremely labile during transfers, difficult to calm down. Increased time to complete  Transfers Transfers: Not assessed Ambulation/Gait Ambulation/Gait Assistance: Not tested (comment)    Exercises     PT Diagnosis: Difficulty walking;Acute pain  PT Problem List: Decreased strength;Decreased activity tolerance;Decreased mobility;Decreased knowledge of use of DME;Decreased safety awareness;Decreased knowledge of precautions;Pain PT Treatment Interventions: DME  instruction;Gait training;Stair training;Functional mobility training;Therapeutic activities;Patient/family education   PT Goals Acute Rehab PT Goals PT Goal Formulation: With patient/family Time For Goal Achievement: 07/07/11 Potential to Achieve Goals: Fair (pending pain ) Pt will Roll Supine to Right Side: with modified independence PT Goal: Rolling Supine to Right Side - Progress: Goal set today Pt will Roll Supine to Left Side: with modified independence PT Goal: Rolling Supine to Left Side - Progress: Goal set today Pt will go Supine/Side to Sit: with modified independence PT Goal: Supine/Side to Sit - Progress: Goal set today Pt will go Sit to Supine/Side: with modified independence PT Goal: Sit to Supine/Side - Progress: Goal set today Pt will go Sit to Stand: with supervision PT Goal: Sit to Stand - Progress: Goal set today Pt will go Stand to Sit: with supervision PT Goal: Stand to Sit - Progress: Goal set today Pt will Transfer Bed to Chair/Chair to Bed: with supervision PT Transfer Goal: Bed to Chair/Chair to Bed - Progress: Goal set today Pt will Ambulate: 51 - 150 feet;with supervision;with least restrictive assistive device PT Goal: Ambulate - Progress: Goal set today Pt will Go Up / Down Stairs: 3-5 stairs;with min assist;with least restrictive assistive device PT Goal: Up/Down Stairs - Progress: Goal set today  Visit Information  Last PT Received On: 06/30/11 Assistance Needed: +1    Subjective Data      Prior Functioning  Home Living Lives With: Family Available Help at Discharge: Family;Available 24 hours/day Type of Home: House Home Access: Stairs to enter [employer] of Steps: 3 Entrance Stairs-Rails: None Home Layout: One level Bathroom Shower/Tub: Forensic scientist: Standard Bathroom Accessibility: Yes How Accessible: Accessible via walker Home Adaptive Equipment: None Prior Function Level of Independence:  Independent Able to Take Stairs?: Yes Driving: Yes Vocation: Unemployed Communication Communication: No difficulties Dominant Hand: Right    Cognition  Overall Cognitive Status: Appears within functional limits for tasks assessed/performed Arousal/Alertness: Awake/alert Orientation Level: Appears intact for tasks assessed Behavior During Session: [facility] for tasks performed    Extremity/Trunk Assessment Right Lower Extremity Assessment RLE ROM/Strength/Tone: Unable to fully assess;Due to pain RLE Sensation: WFL - Light Touch Left Lower Extremity Assessment LLE ROM/Strength/Tone: Unable to fully assess;Due to pain LLE Sensation: WFL - Light Touch   Balance    End of Session PT - End of Session Activity Tolerance: Patient limited by pain Patient left: in bed;with call bell/phone within reach;with family/visitor present Nurse Communication: Mobility status   Milana Kidney 06/30/2011, 11:41 AM  06/30/2011 Milana Kidney DPT PAGER: [phone] OFFICE: [phone]

## 2011-06-30 NOTE — Progress Notes (Signed)
Pt foley removed at 530. At that time, we attempted to ambulate. Pt's pain was severe, 10/10. Pt was not able to ambulate or sit on side of bed.

## 2011-06-30 NOTE — Clinical Social Work Note (Signed)
CSW received a consult for SNF. PT is recommending home health PT. RNCM is aware and following. CSW is signing off a no further needs identified. Please reconsult if a need arises prior to discharge.   Dede Query, MSW, Theresia Majors [phone]

## 2011-06-30 NOTE — Progress Notes (Signed)
51.79 mg cleared from PCA pump. Pump showed last clear time was 05:25 on 06/30/2011

## 2011-06-30 NOTE — Progress Notes (Signed)
Patient ID: Megan Hopkins, female   DOB: 1985-08-14, 27 y.o.   MRN: [id number] Subjective:  The patient complains of back pain. She says she's not getting adequate relief with the IV morphine.  Objective: Vital signs in last 24 hours: Temp:  [97 F (36.1 C)-98.6 F (37 C)] 97.8 F (36.6 C) (06/20 1012) Pulse Rate:  [73-113] 99  (06/20 1012) Resp:  [11-27] 20  (06/20 1012) BP: (86-125)/(48-71) 111/67 mmHg (06/20 1012) SpO2:  [97 %-100 %] 100 % (06/20 1012) FiO2 (%):  [2 %] 2 % (06/19 2020) Weight:  [76.204 kg (168 lb)] 76.204 kg (168 lb) (06/19 2354)  Intake/Output from previous day: 06/19 0701 - 06/20 0700 In: 3950 [I.V.:3450; IV Piggyback:500] Out: 3500 [Urine:3350; Blood:150] Intake/Output this shift:    Physical exam the patient is alert and oriented. She is moving all 4 extremities well.  Lab Results:  [facility] 06/30/11 0645  WBC 12.9*  HGB 12.2  HCT 36.4  PLT 179   BMET  Basename 06/30/11 0645  NA 139  K 3.5  CL 106  CO2 25  GLUCOSE 96  BUN 8  CREATININE 0.79  CALCIUM 8.4    Studies/Results: Dg Lumbar Spine 2-3 Views  06/29/2011  *RADIOLOGY REPORT*  Clinical Data: 26 year old female undergoing lumbar surgery.  DG C-ARM 1-60 MIN, LUMBAR SPINE - 2-3 VIEW  Technique: Two intraoperative fluoroscopic views of the lumbosacral junction.  Fluoroscopy time of 0.5 minutes was utilized.  Comparison:  1155 hours same day.  Findings: Frontal and lateral fluoroscopic spot views demonstrate transpedicular hardware placement at L5 and S1 and interbody hardware placement at the same level.  IMPRESSION: Posterior and interbody fusion at L5-S1 under way.  Original Report Authenticated By: [name], M.Hopkins.   Dg Lumbar Spine 1 View  06/29/2011  *RADIOLOGY REPORT*  Clinical Data: 26 year old female undergoing lumbar spine surgery.  LUMBAR SPINE - 1 VIEW  Comparison: None.  Findings: Portable cross-table lateral intraoperative view of the lumbar spine labeled film #[id number]  hours.  Normal lumbar segmentation presumed.  Anterolisthesis of L5 on S1. Surgical probe directed at the L5 pedicle level.  IMPRESSION: Intraoperative localization at L5 assuming normal lumbar segmentation.  Original Report Authenticated By: [name], M.Hopkins.   Dg C-arm 1-60 Min  06/29/2011  *RADIOLOGY REPORT*  Clinical Data: 26 year old female undergoing lumbar surgery.  DG C-ARM 1-60 MIN, LUMBAR SPINE - 2-3 VIEW  Technique: Two intraoperative fluoroscopic views of the lumbosacral junction.  Fluoroscopy time of 0.5 minutes was utilized.  Comparison:  1155 hours same day.  Findings: Frontal and lateral fluoroscopic spot views demonstrate transpedicular hardware placement at L5 and S1 and interbody hardware placement at the same level.  IMPRESSION: Posterior and interbody fusion at L5-S1 under way.  Original Report Authenticated By: [name], M.Hopkins.    Assessment/Plan: Postop day #1: I will change her PCA to Dilaudid. I'll add Toradol and IV Tylenol. We will mobilize her with PT and OT.  LOS: 1 day     Megan Hopkins 06/30/2011, 10:26 AM

## 2011-06-30 NOTE — Progress Notes (Signed)
OT Cancellation Note  Treatment cancelled today due to medical issues with patient which prohibited therapy - pt. With pain 10/10 all morning.  Pt. With limited participation with PT, and spoke with RN.  Pt. Just starting to settle down, and RN requested OT hold eval today, and re-attempt tomorrow  Boykin Reaper [phone] 06/30/2011, 12:38 PM

## 2011-06-30 NOTE — Progress Notes (Signed)
49.4 of Morphine PCA cleared from pump.

## 2011-06-30 NOTE — Progress Notes (Signed)
11.49 mg of morphine cleared from PCA

## 2011-07-01 MED ORDER — OXYCODONE HCL 40 MG PO TB12: 40.0000 mg | ORAL_TABLET | Freq: Two times a day (BID) | ORAL | Status: DC

## 2011-07-01 MED ORDER — OXYCODONE HCL 40 MG PO TB12
40.0000 mg | ORAL_TABLET | Freq: Two times a day (BID) | ORAL | Status: DC
  Administered 2011-07-01 – 2011-07-03 (×5): 40 mg via ORAL
  Filled 2011-07-01 (×5): qty 1

## 2011-07-01 MED ORDER — OXYCODONE HCL 15 MG PO TABS: 15.0000 mg | ORAL_TABLET | ORAL | Status: AC | PRN

## 2011-07-01 MED ORDER — DIAZEPAM 5 MG PO TABS: 5.0000 mg | ORAL_TABLET | Freq: Four times a day (QID) | ORAL | Status: AC | PRN

## 2011-07-01 MED ORDER — DSS 100 MG PO CAPS: 100.0000 mg | ORAL_CAPSULE | Freq: Two times a day (BID) | ORAL | Status: AC

## 2011-07-01 MED FILL — Sodium Chloride IV Soln 0.9%: INTRAVENOUS | Qty: 1000 | Status: AC

## 2011-07-01 MED FILL — Heparin Sodium (Porcine) Inj 1000 Unit/ML: INTRAMUSCULAR | Qty: 30 | Status: AC

## 2011-07-01 NOTE — Progress Notes (Signed)
Patient ID: Megan Hopkins, female   DOB: [birth year], 26 y.o.   MRN: [id number] Subjective:  The patient is alert and pleasant. She looks much better today. She does continue to complain of back pain is not getting adequate control with her present regimen.  Objective: Vital signs in last 24 hours: Temp:  [97.8 F (36.6 C)-100.2 F (37.9 C)] 99.7 F (37.6 C) (06/21 0513) Pulse Rate:  [93-99] 94  (06/21 0513) Resp:  [18-20] 18  (06/21 0513) BP: (99-137)/(63-83) 100/63 mmHg (06/21 0513) SpO2:  [96 %-100 %] 96 % (06/21 0513)  Intake/Output from previous day: 06/20 0701 - 06/21 0700 In: 240 [P.O.:240] Out: -  Intake/Output this shift:    Physical exam the patient is alert and oriented. She is moving all 4 extremities well.  Lab Results:  [name] 06/30/11 0645  WBC 12.9*  HGB 12.2  HCT 36.4  PLT 179   BMET  Basename 06/30/11 0645  NA 139  K 3.5  CL 106  CO2 25  GLUCOSE 96  BUN 8  CREATININE 0.79  CALCIUM 8.4    Studies/Results: Dg Lumbar Spine 2-3 Views  06/29/2011  *RADIOLOGY REPORT*  Clinical Data: 26 year old female undergoing lumbar surgery.  DG C-ARM 1-60 MIN, LUMBAR SPINE - 2-3 VIEW  Technique: Two intraoperative fluoroscopic views of the lumbosacral junction.  Fluoroscopy time of 0.5 minutes was utilized.  Comparison:  1155 hours same day.  Findings: Frontal and lateral fluoroscopic spot views demonstrate transpedicular hardware placement at L5 and S1 and interbody hardware placement at the same level.  IMPRESSION: Posterior and interbody fusion at L5-S1 under way.  Original Report Authenticated By: [name], M.D.   Dg Lumbar Spine 1 View  06/29/2011  *RADIOLOGY REPORT*  Clinical Data: 26 year old female undergoing lumbar spine surgery.  LUMBAR SPINE - 1 VIEW  Comparison: None.  Findings: Portable cross-table lateral intraoperative view of the lumbar spine labeled film #[id number] hours.  Normal lumbar segmentation presumed.  Anterolisthesis of L5 on S1.  Surgical probe directed at the L5 pedicle level.  IMPRESSION: Intraoperative localization at L5 assuming normal lumbar segmentation.  Original Report Authenticated By: [name], M.D.   Dg C-arm 1-60 Min  06/29/2011  *RADIOLOGY REPORT*  Clinical Data: 26 year old female undergoing lumbar surgery.  DG C-ARM 1-60 MIN, LUMBAR SPINE - 2-3 VIEW  Technique: Two intraoperative fluoroscopic views of the lumbosacral junction.  Fluoroscopy time of 0.5 minutes was utilized.  Comparison:  1155 hours same day.  Findings: Frontal and lateral fluoroscopic spot views demonstrate transpedicular hardware placement at L5 and S1 and interbody hardware placement at the same level.  IMPRESSION: Posterior and interbody fusion at L5-S1 under way.  Original Report Authenticated By: [name], M.D.    Assessment/Plan: Postop day #2: The patient is doing better today. The patient has a history of taking large amounts of oxycodone for a long time. She therefore likely has a combination of a low tolerance for pain and a high tolerance for pain medications. I will therefore add OxyContin. We will plan to discontinue her PCA pump tomorrow. She will be likely discharged home on Sunday. I given the patient will discharge instructions and answered all her questions. Her prescriptions are in the chart.  LOS: 2 days     Megan Hopkins D 07/01/2011, 7:17 AM

## 2011-07-01 NOTE — Progress Notes (Signed)
Physical Therapy Treatment Patient Details Name: Megan Hopkins MRN: [id number] DOB: 09-Dec-1985 Today's Date: 07/01/2011 Time: 0922-1000 PT Time Calculation (min): 38 min  PT Assessment / Plan / Recommendation Comments on Treatment Session  Pt progressing well, she was able to tolerate mobility this session. Pt still limited by pain, atlhough understands the need for mobility to decrease pain. Will attempt RW next session for patients comfort.    Follow Up Recommendations  Home health PT;Supervision/Assistance - 24 hour    Barriers to Discharge        Equipment Recommendations  Rolling walker with 5" wheels    Recommendations for Other Services    Frequency Min 5X/week   Plan Discharge plan remains appropriate;Frequency remains appropriate    Precautions / Restrictions Precautions Precautions: Back Precaution Booklet Issued: Yes (comment) Precaution Comments: pt able to recall 2/3 back precautions. Reeducated Required Braces or Orthoses: Spinal Brace Spinal Brace: Applied in sitting position;Lumbar corset (required max assist) Restrictions Weight Bearing Restrictions: No       Mobility  Bed Mobility Bed Mobility: Rolling Left;Left Sidelying to Sit;Sit to Sidelying Left;Sitting - Scoot to Edge of Bed Rolling Left: 4: Min assist;With rail Left Sidelying to Sit: 4: Min assist;With rails Sitting - Scoot to Edge of Bed: 5: Supervision Details for Bed Mobility Assistance: VC for proper sequencing. Min assist through trunk for stability Transfers Transfers: Sit to Stand;Stand to Sit Sit to Stand: 4: Min assist;With upper extremity assist;From bed;From chair/3-in-1 Stand to Sit: 4: Min assist;With upper extremity assist;4: Min guard;To chair/3-in-1 Details for Transfer Assistance: VC for hand placement. Min assist for stability and to assist with anterior translation Ambulation/Gait Ambulation/Gait Assistance: 4: Min assist Ambulation Distance (Feet): 20 Feet (to/from  bathroom) Assistive device: 1 person hand held assist Ambulation/Gait Assistance Details: VC for sequencing and safety. Will attempt RW next session for comfort Gait Pattern: Step-to pattern;Decreased stride length;Decreased hip/knee flexion - right;Decreased hip/knee flexion - left;Antalgic Gait velocity: decreased gait speed    Exercises     PT Diagnosis:    PT Problem List:   PT Treatment Interventions:     PT Goals Acute Rehab PT Goals PT Goal Formulation: With patient/family PT Goal: Rolling Supine to Right Side - Progress: Progressing toward goal PT Goal: Rolling Supine to Left Side - Progress: Progressing toward goal PT Goal: Supine/Side to Sit - Progress: Progressing toward goal PT Goal: Sit to Supine/Side - Progress: Progressing toward goal PT Goal: Sit to Stand - Progress: Progressing toward goal PT Goal: Stand to Sit - Progress: Progressing toward goal PT Transfer Goal: Bed to Chair/Chair to Bed - Progress: Progressing toward goal PT Goal: Ambulate - Progress: Progressing toward goal  Visit Information  Last PT Received On: 07/01/11 Assistance Needed: +1 PT/OT Co-Evaluation/Treatment: Yes    Subjective Data      Cognition  Overall Cognitive Status: Appears within functional limits for tasks assessed/performed Arousal/Alertness: Awake/alert Orientation Level: Appears intact for tasks assessed Behavior During Session: [employer] [facility] for tasks performed    Balance  Balance Balance Assessed: Yes Dynamic Standing Balance Dynamic Standing - Level of Assistance: 4: Min assist High Level Balance High Level Balance Activites:  (ADLs)  End of Session PT - End of Session Equipment Utilized During Treatment: Gait belt;Back brace Activity Tolerance: Patient limited by pain Patient left: in chair;with call bell/phone within reach;with family/visitor present Nurse Communication: Mobility status    Milana Kidney 07/01/2011, 12:14 PM  07/01/2011 Milana Kidney  DPT PAGER: [phone] OFFICE: [phone]

## 2011-07-01 NOTE — Progress Notes (Signed)
Pt sts excruciating pain throughout the night, she refuses to make even slightest adjustment in bed. Pt is refusing to get OOB to urinate,insists on using the bedpan. Pt was screaming and crying every time this RN attempted to reposition her in bed. Will continue to monitor.

## 2011-07-01 NOTE — Evaluation (Signed)
Occupational Therapy Evaluation Patient Details Name: Megan Hopkins MRN: [id number] DOB: 10-21-1985 Today's Date: 07/01/2011 Time: 0922-1000 OT Time Calculation (min): 38 min  OT Assessment / Plan / Recommendation Clinical Impression  This 26 y.o. female admitted for  PLIF L5-S1.  Pt. limited by pain, but was able to participate in eval and in ADLs.  Currently requires min A for BADLs.  She demonstrates the below listed deficits and will benefit from OT to maximize safety and independence with BADLs to allow her to return home with supervision.      OT Assessment  Patient needs continued OT Services    Follow Up Recommendations  No OT follow up;Supervision/Assistance - 24 hour    Barriers to Discharge None    Equipment Recommendations  Rolling walker with 5" wheels;3 in 1 bedside comode    Recommendations for Other Services    Frequency  Min 2X/week    Precautions / Restrictions Precautions Precautions: Back Precaution Booklet Issued: Yes (comment) Precaution Comments: pt able to recall 2/3 back precautions. Reeducated Required Braces or Orthoses: Spinal Brace Spinal Brace: Applied in sitting position;Lumbar corset (required max assist) Restrictions Weight Bearing Restrictions: No   Pertinent Vitals/Pain 8/10    ADL  Eating/Feeding: Simulated;Independent Where Assessed - Eating/Feeding: Bed level Grooming: Performed;Wash/dry hands;Wash/dry face;Teeth care;Minimal assistance Where Assessed - Grooming: Supported standing Upper Body Bathing: Simulated;Minimal assistance Where Assessed - Upper Body Bathing: Supported sitting Lower Body Bathing: Simulated;Moderate assistance Where Assessed - Lower Body Bathing: Supported sit to stand Upper Body Dressing: Performed;Moderate assistance (gown) Where Assessed - Upper Body Dressing: Unsupported sitting Lower Body Dressing: Performed;Minimal assistance (socks) Where Assessed - Lower Body Dressing: Sopported sit to stand Toilet  Transfer: Performed;Minimal assistance Toilet Transfer Method: Sit to stand Toilet Transfer Equipment: Raised toilet seat with arms (or 3-in-1 over toilet) Toileting - Clothing Manipulation and Hygiene: Performed;Minimal assistance Where Assessed - Glass blower/designer Manipulation and Hygiene: Standing Equipment Used: Back brace Transfers/Ambulation Related to ADLs: Min A and increased time ADL Comments: Pt. instructed in back precautions, and safe techniques for ADLs.  Pt. able to cross ankles over knees to access feet for LB bathing and dressing, but requires increased time.  Pain limits progress today.  Required one sitting rest break during ADLs due to pain    OT Diagnosis: Generalized weakness;Acute pain  OT Problem List: Decreased strength;Decreased activity tolerance;Impaired balance (sitting and/or standing);Decreased knowledge of use of DME or AE;Decreased knowledge of precautions;Pain OT Treatment Interventions: Self-care/ADL training;DME and/or AE instruction;Therapeutic activities;Patient/family education;Balance training   OT Goals Acute Rehab OT Goals OT Goal Formulation: With patient Time For Goal Achievement: 07/01/11 ADL Goals Pt Will Perform Grooming: with supervision;Standing at sink ADL Goal: Grooming - Progress: Goal set today Pt Will Perform Lower Body Bathing: with supervision;Sit to stand from bed;Sit to stand from chair ADL Goal: Lower Body Bathing - Progress: Goal set today Pt Will Perform Lower Body Dressing: with supervision;Sit to stand from bed;Sit to stand from chair ADL Goal: Lower Body Dressing - Progress: Goal set today Pt Will Transfer to Toilet: with supervision;Ambulation;3-in-1 ADL Goal: Toilet Transfer - Progress: Goal set today Pt Will Perform Toileting - Clothing Manipulation: with supervision;Standing ADL Goal: Toileting - Clothing Manipulation - Progress: Goal set today Pt Will Perform Toileting - Hygiene: with supervision;Sit to stand from  3-in-1/toilet ADL Goal: Toileting - Hygiene - Progress: Goal set today Pt Will Perform Tub/Shower Transfer: Tub transfer;with min assist;Ambulation ADL Goal: Tub/Shower Transfer - Progress: Goal set today Additional ADL Goal #1: Pt.  will be independent with donning/doffing brace ADL Goal: Additional Goal #1 - Progress: Goal set today  Visit Information  Last OT Received On: 07/01/11 Assistance Needed: +1    Subjective Data  Subjective: "There is no way I could have done this yesterday" Patient Stated Goal: To get better   Prior Functioning  Home Living Lives With: Family Available Help at Discharge: Family;Available 24 hours/day Type of Home: House Home Access: Stairs to enter [employer] of Steps: 3 Entrance Stairs-Rails: None Home Layout: One level Bathroom Shower/Tub: Forensic scientist: Standard Bathroom Accessibility: Yes How Accessible: Accessible via walker Home Adaptive Equipment: None Prior Function Level of Independence: Independent Able to Take Stairs?: Yes Driving: Yes Vocation: Unemployed Communication Communication: No difficulties Dominant Hand: Right    Cognition  Overall Cognitive Status: Appears within functional limits for tasks assessed/performed Arousal/Alertness: Awake/alert Orientation Level: Appears intact for tasks assessed Behavior During Session: [facility] for tasks performed    Extremity/Trunk Assessment Right Upper Extremity Assessment RUE ROM/Strength/Tone: Within functional levels RUE Coordination: WFL - gross/fine motor Left Upper Extremity Assessment LUE ROM/Strength/Tone: Within functional levels LUE Coordination: WFL - gross/fine motor   Mobility Bed Mobility Bed Mobility: Rolling Left;Left Sidelying to Sit;Sit to Sidelying Left;Sitting - Scoot to Edge of Bed Rolling Left: 4: Min assist;With rail Left Sidelying to Sit: 4: Min assist;With rails Sitting - Scoot to Edge of Bed: 5: Supervision Details for  Bed Mobility Assistance: VC for proper sequencing. Min assist through trunk for stability Transfers Transfers: Sit to Stand;Stand to Sit Sit to Stand: 4: Min assist;With upper extremity assist;From bed;From chair/3-in-1 Stand to Sit: 4: Min assist;With upper extremity assist;4: Min guard;To chair/3-in-1 Details for Transfer Assistance: VC for hand placement. Min assist for stability and to assist with anterior translation   Exercise    Balance Balance Balance Assessed: Yes Dynamic Standing Balance Dynamic Standing - Level of Assistance: 4: Min assist High Level Balance High Level Balance Activites:  (ADLs)  End of Session OT - End of Session Equipment Utilized During Treatment: Back brace Activity Tolerance: Patient limited by pain Patient left: in chair;with call bell/phone within reach;with family/visitor present Nurse Communication: Mobility status   Julie-Ann Vanmaanen M 07/01/2011, 10:50 AM

## 2011-07-01 NOTE — Care Management Note (Unsigned)
    Page 1 of 1   07/01/2011     5:21:57 PM   CARE MANAGEMENT NOTE 07/01/2011  Patient:  Megan Hopkins, Megan Hopkins   Account Number:  [id number]  Date Initiated:  06/30/2011  Documentation initiated by:  [facility]  Subjective/Objective Assessment:   Admitted postop L5-S1 PLIF. Lives with parent.     Action/Plan:   PT eval-recommending HHPT, rolling walker  OT eval-recommending HHOT, 3 in 1   Anticipated DC Date:  07/03/2011   Anticipated DC Plan:  HOME W HOME HEALTH SERVICES      DC Planning Services  CM consult      Choice offered to / List presented to:             Status of service:  In process, will continue to follow Medicare Important Message given?   (If response is "NO", the following Medicare IM given date fields will be blank) Date Medicare IM given:   Date Additional Medicare IM given:    Discharge Disposition:    Per UR Regulation:  Reviewed for med. necessity/level of care/duration of stay  If discussed at Long Length of Stay Meetings, dates discussed:    Comments:  07/01/11 PT and OT recommending HHPT and HHOT, orders pending. Spoke with patient about HHC for HHPT and HHOT.Gave her the [facility] list for [facility] agencies. She will review list with her mom and let CM know which agencies. She is agreeable to a rolling walker and 3 in 1. CM will continue to follow. Jacquelynn Cree RN, BSN, CCM

## 2011-07-02 NOTE — Progress Notes (Signed)
Filed Vitals:   07/02/11 0112 07/02/11 0131 07/02/11 0350 07/02/11 0630  BP:  103/58  100/65  Pulse:  100  97  Temp:  99.4 F (37.4 C)  98.8 F (37.1 C)  TempSrc:  Oral  Oral  Resp: 17 14 14 11   Height:      Weight:      SpO2:  98% 93% 98%    Patient sitting up in chair. Continues on PCA morphine, as well as OxyContin 40 mg every 12 hours, OxyIR 15-30 mg every 4 hours, and Toradol. Patient asking for PCA to be discontinued, however she is using large amounts of PCA morphine, and I think it is impractical to stop the PCA until she is able to manage her pain with an oral regimen. I therefore told her to taper and if possible stopped using the PCA morphine, and if she is successful then we will discontinue it. In the meantime she's continued to work with PT and OT.  Plan: Continued to progress the postoperative recovery.  Hewitt Shorts, MD 07/02/2011, 9:07 AM

## 2011-07-02 NOTE — Progress Notes (Signed)
Physical Therapy Treatment Patient Details Name: Megan Hopkins MRN: [id number] DOB: 13-Oct-1985 Today's Date: 07/02/2011 Time: 0865-7846 PT Time Calculation (min): 35 min  PT Assessment / Plan / Recommendation Comments on Treatment Session  Progressing well, increasing mobility (ambulated ~350' ) and able to tolerate stairs. Pt very slow and anxious with all movements.    Follow Up Recommendations  Home health PT;Supervision/Assistance - 24 hour       Equipment Recommendations  3 in 1 bedside comode (may benefit from RW if she will use)       Frequency Min 5X/week   Plan Discharge plan remains appropriate;Frequency remains appropriate    Precautions / Restrictions Precautions Precautions: Back;Fall Precaution Comments: Stated 3/3 back precautions and generalized in activities. Required Braces or Orthoses: Spinal Brace Spinal Brace: Applied in sitting position;Lumbar corset Restrictions Weight Bearing Restrictions: No       Mobility  Bed Mobility Bed Mobility: Rolling Left;Left Sidelying to Sit;Sit to Sidelying Left;Sitting - Scoot to Edge of Bed Rolling Left: With rail;5: Supervision Left Sidelying to Sit: With rails;5: Supervision;HOB elevated (Pt refusing HOB flat) Sitting - Scoot to Edge of Bed: 5: Supervision Details for Bed Mobility Assistance: VC for proper sequencing. Pt refusing assist, refusing to keep HOB flat. Does not like for LEs to be laid completely flat. Pt over relient on hospital bed. Transfers Transfers: Sit to Stand;Stand to Sit Sit to Stand: 4: Min assist;With upper extremity assist;From bed;Other (comment) (mat) Stand to Sit: With upper extremity assist;4: Min guard;Other (comment);To chair/3-in-1 (mat) Details for Transfer Assistance: VC for hand placement. Min assist for stability and to assist with anterior translation Ambulation/Gait Ambulation/Gait Assistance: 4: Min assist Ambulation Distance (Feet): 350 Feet Assistive device: None (sometimes  will hold onto IV pole) Ambulation/Gait Assistance Details: Pt does not desire to attempt with RW. One loss of balance requiring assist to help correct. Otherwise pt very slow and minimally unstable likely secondary to pain medication. Gait Pattern: Step-to pattern;Decreased stride length;Decreased hip/knee flexion - right;Decreased hip/knee flexion - left Gait velocity: decreased gait speed Stairs: Yes Stairs Assistance: 4: Min assist Stairs Assistance Details (indicate cue type and reason): Verbal cues for safe sequencing.  Stair Management Technique: Forwards;Two rails;Step to pattern Number of Stairs: 2      PT Goals Acute Rehab PT Goals Potential to Achieve Goals:  (pending pain ) Pt will Roll Supine to Left Side: with modified independence PT Goal: Rolling Supine to Left Side - Progress: Progressing toward goal Pt will go Supine/Side to Sit: with modified independence PT Goal: Supine/Side to Sit - Progress: Progressing toward goal Pt will go Sit to Stand: with supervision PT Goal: Sit to Stand - Progress: Progressing toward goal Pt will go Stand to Sit: with supervision PT Goal: Stand to Sit - Progress: Progressing toward goal Pt will Transfer Bed to Chair/Chair to Bed: with supervision PT Transfer Goal: Bed to Chair/Chair to Bed - Progress: Progressing toward goal Pt will Ambulate: 51 - 150 feet;with supervision;with least restrictive assistive device PT Goal: Ambulate - Progress: Progressing toward goal Pt will Go Up / Down Stairs: 3-5 stairs;with least restrictive assistive device;with min assist PT Goal: Up/Down Stairs - Progress: Progressing toward goal  Visit Information  Last PT Received On: 07/02/11 Assistance Needed: +1    Subjective Data  Subjective: I can't move until I have my pain medication Patient Stated Goal: Go home   Cognition  Overall Cognitive Status: Appears within functional limits for tasks assessed/performed Arousal/Alertness:  Awake/alert Orientation Level: Appears  intact for tasks assessed Behavior During Session: Restless       End of Session PT - End of Session Equipment Utilized During Treatment: Gait belt;Back brace Activity Tolerance: Patient limited by pain Patient left: in chair;with call bell/phone within reach;with family/visitor present Nurse Communication: Mobility status    Wilhemina Bonito 07/02/2011, 4:59 PM   Sherie Don) Carleene Mains PT, DPT Acute Rehabilitation [phone]

## 2011-07-02 NOTE — Progress Notes (Signed)
Occupational Therapy Treatment Patient Details Name: Megan Hopkins MRN: [id number] DOB: [birth year] Today's Date: 07/02/2011 Time: 9147-8295 OT Time Calculation (min): 24 min  OT Assessment / Plan / Recommendation Comments on Treatment Session Pt continues to be limited by pain, although she states her pain is much better than yesterday.  Pt is knowledgeable in back precautions for ADL and moving with min guard to supervision. Able to donn/doff back brace with set up.    Follow Up Recommendations  No OT follow up;Supervision/Assistance - 24 hour    Barriers to Discharge       Equipment Recommendations  Rolling walker with 5" wheels;3 in 1 bedside comode    Recommendations for Other Services    Frequency Min 2X/week   Plan Discharge plan remains appropriate    Precautions / Restrictions Precautions Precautions: Back;Fall Precaution Comments: Stated 3/3 back precautions and generalized in activities. Required Braces or Orthoses: Spinal Brace Spinal Brace: Applied in sitting position;Lumbar corset Restrictions Weight Bearing Restrictions: No   Pertinent Vitals/Pain     ADL  Grooming: Performed;Wash/dry hands;Teeth care;Supervision/safety Where Assessed - Grooming: Supported standing Lower Body Bathing: Simulated;Supervision/safety Where Assessed - Lower Body Bathing: Supported sit to stand Lower Body Dressing: Performed;Modified independent Where Assessed - Lower Body Dressing: Sopported sit to stand Toilet Transfer: Performed;Supervision/safety Toilet Transfer Method: Sit to Barista: Raised toilet seat with arms (or 3-in-1 over toilet) Toileting - Clothing Manipulation and Hygiene: Performed;Modified independent Where Assessed - Toileting Clothing Manipulation and Hygiene: Standing Tub/Shower Transfer Equipment: Other (comment) (educated in use of 3 in 1 in shower vs. resin chair) Equipment Used: Back brace Transfers/Ambulation Related to ADLs:  min guard assist and extra time ADL Comments: Pt able to cross foot over opposite knee with ease to access for ADL.  Educated pt in retrieval of objects from the floor, standing for prolonged periods with change of position for back relief.  Instructed pt in only sit for 45 to 60 min and then change position. Pt reports performing bathing and dressing with supervision/set up this morning.  Pt stated she has more confidence going home having done self care.    OT Diagnosis:    OT Problem List:   OT Treatment Interventions:     OT Goals ADL Goals Pt Will Perform Grooming: with supervision;Standing at sink ADL Goal: Grooming - Progress: Met Pt Will Perform Lower Body Bathing: with supervision;Sit to stand from bed;Sit to stand from chair ADL Goal: Lower Body Bathing - Progress: Met Pt Will Perform Lower Body Dressing: with supervision;Sit to stand from bed;Sit to stand from chair ADL Goal: Lower Body Dressing - Progress: Met Pt Will Transfer to Toilet: with supervision;Ambulation;3-in-1 ADL Goal: Toilet Transfer - Progress: Met Pt Will Perform Toileting - Clothing Manipulation: with supervision;Standing ADL Goal: Toileting - Clothing Manipulation - Progress: Met Pt Will Perform Toileting - Hygiene: with supervision;Sit to stand from 3-in-1/toilet ADL Goal: Toileting - Hygiene - Progress: Met Additional ADL Goal #1: Pt. will be independent with donning/doffing brace ADL Goal: Additional Goal #1 - Progress: Met  Visit Information  Last OT Received On: 07/02/11 Assistance Needed: +1    Subjective Data      Prior Functioning       Cognition  Overall Cognitive Status: Appears within functional limits for tasks assessed/performed Arousal/Alertness: Awake/alert Orientation Level: Appears intact for tasks assessed Behavior During Session: Restless    Mobility Bed Mobility Bed Mobility: Sit to Sidelying Left;Rolling Right Rolling Right: 6: Modified independent (Device/Increase  time);With rail  Sit to Sidelying Left: 6: Modified independent (Device/Increase time);With rail Transfers Transfers: Sit to Stand;Stand to Sit Sit to Stand: 4: Min guard;From chair/3-in-1 Stand to Sit: 4: Min guard;To bed;To chair/3-in-1   Exercises    Balance    End of Session OT - End of Session Activity Tolerance: Patient limited by pain Patient left: in bed;with call bell/phone within reach Nurse Communication: Other (comment) (pain)   Evern Bio 07/02/2011, 12:47 PM [phone]

## 2011-07-03 NOTE — Progress Notes (Signed)
Physical Therapy Treatment Patient Details Name: Megan Hopkins MRN: [id number] DOB: Jan 11, 1985 Today's Date: 07/03/2011 Time: 4098-1191 PT Time Calculation (min): 14 min  PT Assessment / Plan / Recommendation Comments on Treatment Session  Pt progressing, amble to ambulate this session without any assitive device or assistance. Still requiring supervision due to pain meds and anxiety.    Follow Up Recommendations  No PT follow up;Supervision for mobility/OOB    Barriers to Discharge        Equipment Recommendations  3 in 1 bedside comode    Recommendations for Other Services    Frequency Min 5X/week   Plan Discharge plan remains appropriate;Frequency remains appropriate    Precautions / Restrictions Precautions Precautions: Back;Fall Precaution Comments: pt able to recall and maintain 3/3 back precautions during treatment Required Braces or Orthoses: Spinal Brace Spinal Brace: Applied in sitting position;Lumbar corset Restrictions Weight Bearing Restrictions: No       Mobility  Bed Mobility Bed Mobility: Rolling Left;Left Sidelying to Sit;Sitting - Scoot to Edge of Bed Rolling Left: 6: Modified independent (Device/Increase time) Left Sidelying to Sit: 6: Modified independent (Device/Increase time) Sitting - Scoot to Edge of Bed: 6: Modified independent (Device/Increase time) Transfers Transfers: Sit to Stand;Stand to Sit Sit to Stand: 5: Supervision;With upper extremity assist;From bed Stand to Sit: 5: Supervision;With upper extremity assist;To chair/3-in-1 Details for Transfer Assistance: Supervision for safety. Cues to maintain back precaution during anterior translation into standing Ambulation/Gait Ambulation/Gait Assistance: 5: Supervision Ambulation Distance (Feet): 300 Feet Assistive device: None Ambulation/Gait Assistance Details: No LOB throughout ambulation, no assistive device or holding onto IV pole. Pt moving slowly, although safely. Gait Pattern:  Step-to pattern;Decreased stride length Gait velocity: decreased gait speed Stairs: No    Exercises     PT Diagnosis:    PT Problem List:   PT Treatment Interventions:     PT Goals Acute Rehab PT Goals PT Goal: Rolling Supine to Right Side - Progress: Met PT Goal: Rolling Supine to Left Side - Progress: Met PT Goal: Supine/Side to Sit - Progress: Met PT Goal: Sit to Supine/Side - Progress: Met PT Goal: Sit to Stand - Progress: Met PT Goal: Stand to Sit - Progress: Met PT Transfer Goal: Bed to Chair/Chair to Bed - Progress: Met PT Goal: Ambulate - Progress: Met PT Goal: Up/Down Stairs - Progress: Progressing toward goal  Visit Information  Last PT Received On: 07/03/11 Assistance Needed: +1    Subjective Data      Cognition  Overall Cognitive Status: Appears within functional limits for tasks assessed/performed Arousal/Alertness: Awake/alert Orientation Level: Appears intact for tasks assessed Behavior During Session: [facility] for tasks performed    Balance     End of Session PT - End of Session Equipment Utilized During Treatment: Gait belt;Back brace Activity Tolerance: Patient tolerated treatment well Patient left: in chair;with call bell/phone within reach Nurse Communication: Mobility status    Milana Kidney 07/03/2011, 9:54 AM  07/03/2011 Milana Kidney DPT PAGER: [phone] OFFICE: [phone]

## 2011-07-03 NOTE — Progress Notes (Signed)
   CARE MANAGEMENT NOTE 07/03/2011  Patient:  Megan Hopkins, Megan Hopkins   Account Number:  [id number]  Date Initiated:  06/30/2011  Documentation initiated by:  [facility]  Subjective/Objective Assessment:   Admitted postop L5-S1 PLIF. Lives with parent.     Action/Plan:   PT eval-recommending HHPT, rolling walker  OT eval-recommending HHOT, 3 in 1   Anticipated DC Date:  07/03/2011   Anticipated DC Plan:  HOME W HOME HEALTH SERVICES      DC Planning Services  CM consult      Choice offered to / List presented to:     DME arranged  3-N-1      DME agency  [facility]        Status of service:  Completed, signed off Medicare Important Message given?   (If response is "NO", the following Medicare IM given date fields will be blank) Date Medicare IM given:   Date Additional Medicare IM given:    Discharge Disposition:  HOME/SELF CARE  Per UR Regulation:  Reviewed for med. necessity/level of care/duration of stay  If discussed at Long Length of Stay Meetings, dates discussed:    Comments:  07/03/2011 1000 3n1 ordered with [facility]. Pt states RW was not needed at home and PT/OT was not recommended at d/c. Isidoro Donning RN CCM Case Mgmt phone [phone]  07/01/11 PT and OT recommending HHPT and HHOT, orders pending. Spoke with patient about HHC for HHPT and HHOT.Gave her the [facility] list for [facility] agencies. She will review list with her mom and let CM know which agencies. She is agreeable to a rolling walker and 3 in 1. CM will continue to follow. Jacquelynn Cree RN, BSN, CCM

## 2011-07-03 NOTE — Discharge Instructions (Addendum)
Spinal Fusion Care After Refer to this sheet in the next few weeks. These instructions provide you with information on caring for yourself after your procedure. Your caregiver may also give you more specific instructions. Your treatment has been planned according to current medical practices, but problems sometimes occur. Call your caregiver if you have any problems or questions after your procedure. HOME CARE INSTRUCTIONS   Take whatever pain medicine has been prescribed by your caregiver. Do not take over-the-counter pain medicine unless directed otherwise by your caregiver.   Do not drive if you are taking narcotic pain medicines.   Change your bandage (dressing) if necessary or as directed by your caregiver.   Do not get your surgical cut (incision) wet. After a few days you may take quick showers (rather than baths), but keep your incision clean and dry. Covering the incision with plastic wrap while you shower should keep your incision dry. A few weeks after surgery, once your incision has healed and your caregiver says it is okay, you can take baths or go swimming.   If you have been prescribed medicine to prevent your blood from clotting, follow the directions carefully.   Check the area around your incision often. Look for redness and swelling. Also, look for anything leaking from your wound. You can use a mirror or have a family member inspect your incision if it is in a place where it is difficult for you to see.   Ask your caregiver what activities you should avoid and for how long.   Walk as much as possible.   Do not lift anything heavier than 10 pounds (4.5 kilograms) until your caregiver says it is safe.   Do not twist or bend for a few weeks. Try not to pull on things. Avoid sitting for long periods of time. Change positions at least every hour.   Ask your caregiver what kinds of exercise you should do to make your back stronger and when you should begin doing these  exercises.  SEEK IMMEDIATE MEDICAL CARE IF:   Pain suddenly becomes much worse.   The incision area is red, swollen, bleeding, or leaking fluid.   Your legs or feet become increasingly painful, numb, weak, or swollen.   You have trouble controlling urination or bowel movements.   You have trouble breathing.   You have chest pain.   You have a fever.  MAKE SURE YOU:  Understand these instructions.   Will watch your condition.   Will get help right away if you are not doing well or get worse.  Document Released: 07/16/2004 Document Revised: 12/16/2010 Document Reviewed: 03/11/2010 [facility] Patient Information 2012 [place], [place].

## 2011-07-03 NOTE — Discharge Summary (Signed)
Physician Discharge Summary  Patient ID: Megan Hopkins MRN: [id number] DOB/AGE: 06/14/1985 25 y.o.  Admit date: 06/29/2011 Discharge date: 07/03/2011  Admission Diagnoses: Spondylolisthesis L5-S1    Discharge Diagnoses: Same   Discharged Condition: good  Hospital Course: The patient was admitted on 06/29/2011 and taken to the operating room where the patient underwent PLIF L5-S1. The patient tolerated the procedure well and was taken to the recovery room and then to the floor in stable condition. The hospital course was routine. There were no complications. The wound remained clean dry and intact. Pt had appropriate back soreness. No complaints of leg pain or new N/T/W. The patient remained afebrile with stable vital signs, and tolerated a regular diet. The patient continued to increase activities, and pain was well controlled with oral pain medications.   Consults: None  Significant Diagnostic Studies:  Results for orders placed during the hospital encounter of 06/29/11  CBC      Component Value Range   WBC 12.9 (*) 4.0 - 10.5 K/uL   RBC 4.27  3.87 - 5.11 MIL/uL   Hemoglobin 12.2  12.0 - 15.0 g/dL   HCT 40.9  81.1 - 91.4 %   MCV 85.2  78.0 - 100.0 fL   MCH 28.6  26.0 - 34.0 pg   MCHC 33.5  30.0 - 36.0 g/dL   RDW 78.2  95.6 - 21.3 %   Platelets 179  150 - 400 K/uL  BASIC METABOLIC PANEL      Component Value Range   Sodium 139  135 - 145 mEq/L   Potassium 3.5  3.5 - 5.1 mEq/L   Chloride 106  96 - 112 mEq/L   CO2 25  19 - 32 mEq/L   Glucose, Bld 96  70 - 99 mg/dL   BUN 8  6 - 23 mg/dL   Creatinine, Ser 0.86  0.50 - 1.10 mg/dL   Calcium 8.4  8.4 - 57.8 mg/dL   GFR calc non Af Amer >90  >90 mL/min   GFR calc Af Amer >90  >90 mL/min    Dg Lumbar Spine 2-3 Views  06/29/2011  *RADIOLOGY REPORT*  Clinical Data: 26 year old female undergoing lumbar surgery.  DG C-ARM 1-60 MIN, LUMBAR SPINE - 2-3 VIEW  Technique: Two intraoperative fluoroscopic views of the lumbosacral  junction.  Fluoroscopy time of 0.5 minutes was utilized.  Comparison:  1155 hours same day.  Findings: Frontal and lateral fluoroscopic spot views demonstrate transpedicular hardware placement at L5 and S1 and interbody hardware placement at the same level.  IMPRESSION: Posterior and interbody fusion at L5-S1 under way.  Original Report Authenticated By: [name], M.D.   Dg Lumbar Spine 1 View  06/29/2011  *RADIOLOGY REPORT*  Clinical Data: 26 year old female undergoing lumbar spine surgery.  LUMBAR SPINE - 1 VIEW  Comparison: None.  Findings: Portable cross-table lateral intraoperative view of the lumbar spine labeled film #1 1155 hours.  Normal lumbar segmentation presumed.  Anterolisthesis of L5 on S1. Surgical probe directed at the L5 pedicle level.  IMPRESSION: Intraoperative localization at L5 assuming normal lumbar segmentation.  Original Report Authenticated By: [name], M.D.   Dg C-arm 1-60 Min  06/29/2011  *RADIOLOGY REPORT*  Clinical Data: 26 year old female undergoing lumbar surgery.  DG C-ARM 1-60 MIN, LUMBAR SPINE - 2-3 VIEW  Technique: Two intraoperative fluoroscopic views of the lumbosacral junction.  Fluoroscopy time of 0.5 minutes was utilized.  Comparison:  1155 hours same day.  Findings: Frontal and lateral fluoroscopic spot views  demonstrate transpedicular hardware placement at L5 and S1 and interbody hardware placement at the same level.  IMPRESSION: Posterior and interbody fusion at L5-S1 under way.  Original Report Authenticated By: [name], M.D.    Antibiotics:  Anti-infectives     Start     Dose/Rate Route Frequency Ordered Stop   06/29/11 1915   ceFAZolin (ANCEF) IVPB 2 g/50 mL premix        2 g 100 mL/hr over 30 Minutes Intravenous Every 8 hours 06/29/11 1731 06/30/11 0332   06/29/11 1226   bacitracin 50,000 Units in sodium chloride irrigation 0.9 % 500 mL irrigation  Status:  Discontinued          As needed 06/29/11 1226 06/29/11 1456   06/29/11  1107   bacitracin 16109 UNITS injection     Comments: Reece Packer: cabinet override         06/29/11 1107 06/29/11 2314   06/29/11 1033   bacitracin 60454 UNITS injection     Comments: Reece Packer: cabinet override         06/29/11 1033 06/29/11 2244   06/29/11 0000   ceFAZolin (ANCEF) IVPB 1 g/50 mL premix  Status:  Discontinued        1 g 100 mL/hr over 30 Minutes Intravenous 60 min pre-op 06/28/11 1428 06/29/11 1651          Discharge Exam: Blood pressure 102/66, pulse 96, temperature 98.7 F (37.1 C), temperature source Oral, resp. rate 100, height 5\' 8"  (1.727 m), weight 76.204 kg (168 lb), last menstrual period 06/10/2011, SpO2 97.00%. Neurologic: Grossly normal Incision clean dry and intact  Discharge Medications:   Medication List  As of 07/03/2011  9:36 AM   STOP taking these medications         LORazepam 0.5 MG tablet         TAKE these medications         BENADRYL 25 MG tablet   Generic drug: diphenhydrAMINE   Take 25 mg by mouth at bedtime as needed. For sleep.      diazepam 5 MG tablet   Commonly known as: VALIUM   Take 1 tablet (5 mg total) by mouth every 6 (six) hours as needed.      DSS 100 MG Caps   Take 100 mg by mouth 2 (two) times daily.      oxyCODONE 40 MG 12 hr tablet   Commonly known as: OXYCONTIN   Take 1 tablet (40 mg total) by mouth every 12 (twelve) hours.      oxyCODONE 15 MG immediate release tablet   Commonly known as: ROXICODONE   Take 1-2 tablets (15-30 mg total) by mouth every 4 (four) hours as needed.            Disposition: Home   Final Dx: PLIF L5-S1  Discharge Orders    Future Orders Please Complete By Expires   Diet - low sodium heart healthy      Increase activity slowly      Call MD for:  temperature >100.4      Call MD for:  persistant nausea and vomiting      Call MD for:  severe uncontrolled pain      Call MD for:  redness, tenderness, or signs of infection (pain, swelling, redness, odor or  green/yellow discharge around incision site)      Call MD for:  difficulty breathing, headache or visual disturbances      Remove dressing in  48 hours      Driving Restrictions      Comments:   At least 2 weeks   Lifting restrictions      Comments:   Less than 8 pounds      Follow-up Information    Follow up with Cristi Loron, MD. Schedule an appointment as soon as possible for a visit in 2 weeks.   Contact information:   [address], [address] [place] [address] [phone]           Signed: Tia Alert 07/03/2011, 9:36 AM

## 2012-03-13 ENCOUNTER — Emergency Department: Payer: Self-pay | Admitting: Emergency Medicine

## 2012-03-13 LAB — URINALYSIS, COMPLETE
Bilirubin,UR: NEGATIVE
Blood: NEGATIVE
Glucose,UR: NEGATIVE mg/dL (ref 0–75)
Leukocyte Esterase: NEGATIVE
Protein: NEGATIVE
RBC,UR: <1 /HPF (ref 0–5)
Specific Gravity: 1.013 (ref 1.003–1.030)

## 2013-01-01 ENCOUNTER — Emergency Department: Payer: Self-pay | Admitting: Internal Medicine

## 2014-04-16 LAB — URINALYSIS, COMPLETE
BILIRUBIN, UR: NEGATIVE
Blood: NEGATIVE
GLUCOSE, UR: NEGATIVE mg/dL (ref 0–75)
KETONE: NEGATIVE
LEUKOCYTE ESTERASE: NEGATIVE
Nitrite: NEGATIVE
PH: 7 (ref 4.5–8.0)
Protein: NEGATIVE
RBC,UR: 2 /HPF (ref 0–5)
Specific Gravity: 1.018 (ref 1.003–1.030)

## 2014-04-16 LAB — BASIC METABOLIC PANEL
Anion Gap: 13 (ref 7–16)
BUN: 7 mg/dL
Calcium, Total: 8.2 mg/dL — ABNORMAL LOW
Chloride: 107 mmol/L
Co2: 18 mmol/L — ABNORMAL LOW
Creatinine: 0.62 mg/dL
EGFR (African American): >60
EGFR (Non-African Amer.): >60
GLUCOSE: 100 mg/dL — AB
POTASSIUM: 3 mmol/L — AB
SODIUM: 138 mmol/L

## 2014-04-16 LAB — CBC WITH DIFFERENTIAL/PLATELET
Basophil #: 0 10*3/uL (ref 0.0–0.1)
Basophil %: 0.1 %
EOS PCT: 0 %
Eosinophil #: 0 10*3/uL (ref 0.0–0.7)
HCT: 36.9 % (ref 35.0–47.0)
HGB: 12 g/dL (ref 12.0–16.0)
LYMPHS ABS: 0.7 10*3/uL — AB (ref 1.0–3.6)
Lymphocyte %: 3.8 %
MCH: 27.5 pg (ref 26.0–34.0)
MCHC: 32.6 g/dL (ref 32.0–36.0)
MCV: 84 fL (ref 80–100)
MONO ABS: 1.1 x10 3/mm — AB (ref 0.2–0.9)
Monocyte %: 5.7 %
NEUTROS ABS: 17.8 10*3/uL — AB (ref 1.4–6.5)
Neutrophil %: 90.4 %
Platelet: 178 10*3/uL (ref 150–440)
RBC: 4.38 10*6/uL (ref 3.80–5.20)
RDW: 15 % — ABNORMAL HIGH (ref 11.5–14.5)
WBC: 19.7 10*3/uL — AB (ref 3.6–11.0)

## 2014-04-16 LAB — DRUG SCREEN, URINE
Amphetamines, Ur Screen: NEGATIVE
Barbiturates, Ur Screen: NEGATIVE
Benzodiazepine, Ur Scrn: NEGATIVE
COCAINE METABOLITE, UR ~~LOC~~: NEGATIVE
Cannabinoid 50 Ng, Ur ~~LOC~~: NEGATIVE
MDMA (ECSTASY) UR SCREEN: NEGATIVE
Methadone, Ur Screen: NEGATIVE
OPIATE, UR SCREEN: POSITIVE
Phencyclidine (PCP) Ur S: NEGATIVE
Tricyclic, Ur Screen: NEGATIVE

## 2014-04-16 LAB — GC/CHLAMYDIA PROBE AMP

## 2014-04-17 ENCOUNTER — Ambulatory Visit (HOSPITAL_COMMUNITY)
Admission: AD | Admit: 2014-04-17 | Discharge: 2014-04-17 | Disposition: A | Discharge status: Home / Self Care | Payer: Medicaid Other | Source: Other Acute Inpatient Hospital | Attending: Obstetrics & Gynecology | Admitting: Obstetrics & Gynecology

## 2014-04-17 ENCOUNTER — Inpatient Hospital Stay
Admit: 2014-04-17 | Disposition: A | Discharge status: Other Acute Inpatient Hospital | Payer: Self-pay | Attending: Certified Nurse Midwife | Admitting: Certified Nurse Midwife

## 2014-04-17 DIAGNOSIS — R109 Unspecified abdominal pain: Secondary | ICD-10-CM | POA: Diagnosis present

## 2014-04-17 LAB — COMPREHENSIVE METABOLIC PANEL
ALBUMIN: 2.3 g/dL — AB
ANION GAP: 9 (ref 7–16)
Alkaline Phosphatase: 144 U/L — ABNORMAL HIGH
BUN: 8 mg/dL
Bilirubin,Total: 0.2 mg/dL — ABNORMAL LOW
CALCIUM: 8 mg/dL — AB
CHLORIDE: 106 mmol/L
CO2: 22 mmol/L
Creatinine: 0.55 mg/dL
EGFR (Non-African Amer.): >60
GLUCOSE: 102 mg/dL — AB
Potassium: 3.5 mmol/L
SGOT(AST): 34 U/L
SGPT (ALT): 32 U/L
Sodium: 137 mmol/L
Total Protein: 6.1 g/dL — ABNORMAL LOW

## 2014-04-17 LAB — CBC WITH DIFFERENTIAL/PLATELET
BASOS PCT: 0.1 %
Basophil #: 0 10*3/uL (ref 0.0–0.1)
Eosinophil #: 0 10*3/uL (ref 0.0–0.7)
Eosinophil %: 0 %
HCT: 34.8 % — ABNORMAL LOW (ref 35.0–47.0)
HGB: 11.6 g/dL — AB (ref 12.0–16.0)
Lymphocyte #: 1.4 10*3/uL (ref 1.0–3.6)
Lymphocyte %: 7.8 %
MCH: 27.8 pg (ref 26.0–34.0)
MCHC: 33.2 g/dL (ref 32.0–36.0)
MCV: 84 fL (ref 80–100)
MONOS PCT: 5 %
Monocyte #: 0.9 x10 3/mm (ref 0.2–0.9)
NEUTROS PCT: 87.1 %
Neutrophil #: 15.6 10*3/uL — ABNORMAL HIGH (ref 1.4–6.5)
PLATELETS: 195 10*3/uL (ref 150–440)
RBC: 4.16 10*6/uL (ref 3.80–5.20)
RDW: 15 % — ABNORMAL HIGH (ref 11.5–14.5)
WBC: 17.9 10*3/uL — ABNORMAL HIGH (ref 3.6–11.0)

## 2014-04-17 LAB — LIPASE, BLOOD: Lipase: 50 U/L

## 2014-04-18 LAB — URINE CULTURE

## 2014-04-19 LAB — BETA STREP CULTURE(ARMC)

## 2014-05-11 ENCOUNTER — Emergency Department: Payer: Medicaid Other

## 2014-05-11 ENCOUNTER — Emergency Department (HOSPITAL_COMMUNITY): Payer: Self-pay

## 2014-05-11 ENCOUNTER — Observation Stay
Admission: EM | Admit: 2014-05-11 | Discharge: 2014-05-15 | Disposition: A | Discharge status: Home / Self Care | Payer: Medicaid Other | Attending: Surgery | Admitting: Surgery

## 2014-05-11 DIAGNOSIS — B954 Other streptococcus as the cause of diseases classified elsewhere: Secondary | ICD-10-CM | POA: Diagnosis not present

## 2014-05-11 DIAGNOSIS — R609 Edema, unspecified: Secondary | ICD-10-CM | POA: Diagnosis present

## 2014-05-11 DIAGNOSIS — A419 Sepsis, unspecified organism: Secondary | ICD-10-CM

## 2014-05-11 DIAGNOSIS — O99345 Other mental disorders complicating the puerperium: Secondary | ICD-10-CM | POA: Insufficient documentation

## 2014-05-11 DIAGNOSIS — O99335 Smoking (tobacco) complicating the puerperium: Secondary | ICD-10-CM | POA: Diagnosis not present

## 2014-05-11 DIAGNOSIS — N611 Abscess of the breast and nipple: Secondary | ICD-10-CM

## 2014-05-11 DIAGNOSIS — O9122 Nonpurulent mastitis associated with the puerperium: Secondary | ICD-10-CM | POA: Diagnosis not present

## 2014-05-11 DIAGNOSIS — N61 Inflammatory disorders of breast: Secondary | ICD-10-CM | POA: Diagnosis present

## 2014-05-11 LAB — COMPREHENSIVE METABOLIC PANEL
ALT: 17 U/L (ref 14–54)
AST: 19 U/L (ref 15–41)
Albumin: 3 g/dL — ABNORMAL LOW (ref 3.5–5.0)
Alkaline Phosphatase: 94 U/L (ref 38–126)
Anion gap: 8 (ref 5–15)
BUN: 11 mg/dL (ref 6–20)
CO2: 28 mmol/L (ref 22–32)
Calcium: 9 mg/dL (ref 8.9–10.3)
Chloride: 105 mmol/L (ref 101–111)
Creatinine, Ser: 0.83 mg/dL (ref 0.44–1.00)
GFR calc Af Amer: >60 mL/min (ref >60)
Glucose, Bld: 76 mg/dL (ref 65–99)
Potassium: 4.3 mmol/L (ref 3.5–5.1)
Sodium: 141 mmol/L (ref 135–145)
Total Bilirubin: 0.2 mg/dL — ABNORMAL LOW (ref 0.3–1.2)
Total Protein: 7.5 g/dL (ref 6.5–8.1)

## 2014-05-11 LAB — CBC WITH DIFFERENTIAL/PLATELET
BASOS ABS: 0.1 10*3/uL (ref 0–0.1)
EOS ABS: 1 10*3/uL — AB (ref 0–0.7)
HCT: 33.5 % — ABNORMAL LOW (ref 35.0–47.0)
Hemoglobin: 10.9 g/dL — ABNORMAL LOW (ref 12.0–16.0)
LYMPHS ABS: 2.3 10*3/uL (ref 1.0–3.6)
Lymphocytes Relative: 18 %
MCH: 26.7 pg (ref 26.0–34.0)
MCHC: 32.5 g/dL (ref 32.0–36.0)
MCV: 82.1 fL (ref 80.0–100.0)
Monocytes Absolute: 1.1 10*3/uL — ABNORMAL HIGH (ref 0.2–0.9)
Neutro Abs: 8.1 10*3/uL — ABNORMAL HIGH (ref 1.4–6.5)
Neutrophils Relative %: 65 %
Platelets: 452 10*3/uL — ABNORMAL HIGH (ref 150–440)
RBC: 4.08 MIL/uL (ref 3.80–5.20)
RDW: 14.6 % — ABNORMAL HIGH (ref 11.5–14.5)
WBC: 12.5 10*3/uL — ABNORMAL HIGH (ref 3.6–11.0)

## 2014-05-11 MED ORDER — SERTRALINE HCL 50 MG PO TABS
50.0000 mg | ORAL_TABLET | Freq: Every day | ORAL | Status: DC
  Administered 2014-05-11 – 2014-05-15 (×5): 50 mg via ORAL
  Filled 2014-05-11 (×5): qty 1

## 2014-05-11 MED ORDER — HEPARIN SODIUM (PORCINE) 5000 UNIT/ML IJ SOLN
5000.0000 [IU] | Freq: Three times a day (TID) | INTRAMUSCULAR | Status: DC
  Administered 2014-05-11 – 2014-05-15 (×11): 5000 [IU] via SUBCUTANEOUS
  Filled 2014-05-11 (×11): qty 1

## 2014-05-11 MED ORDER — MORPHINE SULFATE 4 MG/ML IJ SOLN
INTRAMUSCULAR | Status: AC
  Administered 2014-05-11: 4 mg via INTRAVENOUS
  Filled 2014-05-11: qty 1

## 2014-05-11 MED ORDER — HYDROCODONE-ACETAMINOPHEN 5-325 MG PO TABS
1.0000 | ORAL_TABLET | ORAL | Status: DC | PRN
  Administered 2014-05-11 – 2014-05-14 (×15): 2 via ORAL
  Administered 2014-05-15: 1 via ORAL
  Administered 2014-05-15 (×5): 2 via ORAL
  Filled 2014-05-11 (×22): qty 2

## 2014-05-11 MED ORDER — VANCOMYCIN HCL IN DEXTROSE 1-5 GM/200ML-% IV SOLN
INTRAVENOUS | Status: AC
  Administered 2014-05-11: 1 g via INTRAVENOUS
  Filled 2014-05-11: qty 200

## 2014-05-11 MED ORDER — SODIUM CHLORIDE 0.9 % IV BOLUS (SEPSIS)
1000.0000 mL | Freq: Once | INTRAVENOUS | Status: AC
  Administered 2014-05-11: 1000 mL via INTRAVENOUS

## 2014-05-11 MED ORDER — ONDANSETRON HCL 4 MG/2ML IJ SOLN
4.0000 mg | Freq: Once | INTRAMUSCULAR | Status: AC
  Administered 2014-05-11: 4 mg via INTRAVENOUS

## 2014-05-11 MED ORDER — MORPHINE SULFATE 2 MG/ML IJ SOLN
INTRAMUSCULAR | Status: AC
  Administered 2014-05-11: 2 mg via INTRAVENOUS
  Filled 2014-05-11: qty 1

## 2014-05-11 MED ORDER — MORPHINE SULFATE 2 MG/ML IJ SOLN
1.0000 mg | INTRAMUSCULAR | Status: DC | PRN
  Administered 2014-05-11 – 2014-05-12 (×7): 1 mg via INTRAVENOUS
  Filled 2014-05-11 (×7): qty 1

## 2014-05-11 MED ORDER — VANCOMYCIN HCL 10 G IV SOLR: 1000.0000 mg | Freq: Once | INTRAVENOUS | Status: AC

## 2014-05-11 MED ORDER — MORPHINE SULFATE 4 MG/ML IJ SOLN
4.0000 mg | Freq: Once | INTRAMUSCULAR | Status: AC
  Administered 2014-05-11: 4 mg via INTRAVENOUS

## 2014-05-11 MED ORDER — DEXTROSE-NACL 5-0.45 % IV SOLN
INTRAVENOUS | Status: DC
  Administered 2014-05-11 – 2014-05-15 (×8): via INTRAVENOUS

## 2014-05-11 MED ORDER — METHADONE HCL 10 MG/ML PO CONC
60.0000 mg | Freq: Every day | ORAL | Status: DC
  Administered 2014-05-11 – 2014-05-15 (×5): 60 mg via ORAL
  Filled 2014-05-11 (×5): qty 6

## 2014-05-11 MED ORDER — CIPROFLOXACIN IN D5W 400 MG/200ML IV SOLN
400.0000 mg | Freq: Two times a day (BID) | INTRAVENOUS | Status: DC
  Administered 2014-05-11 – 2014-05-12 (×2): 400 mg via INTRAVENOUS
  Filled 2014-05-11 (×4): qty 200

## 2014-05-11 MED ORDER — SODIUM CHLORIDE 0.9 % IV BOLUS (SEPSIS): 310.0000 mL | Freq: Once | INTRAVENOUS | Status: DC

## 2014-05-11 MED ORDER — ONDANSETRON HCL 4 MG/2ML IJ SOLN
INTRAMUSCULAR | Status: AC
  Administered 2014-05-11: 4 mg via INTRAVENOUS
  Filled 2014-05-11: qty 2

## 2014-05-11 NOTE — ED Provider Notes (Signed)
[facility] Emergency Department Provider Note    ____________________________________________  Time seen: ----------------------------------------- 6:16 PM on 05/11/2014 -----------------------------------------    I have reviewed the triage vital signs and the nursing notes.   HISTORY  Chief Complaint Breast Problem       HPI Megan Hopkins is a 29 y.o. female with history of anxiety and migraines who presents for valuation of left breast swelling. Patient is postpartum, has a 6621-day-old infant but not currently breast-feeding. She reports that she has had swelling and redness of the left breast for close to 2 weeks. She was started on dicloxacillin on 05/06/14 but has had worsening breast swelling since then. She has since developed additional swelling and drainage. Gradual onset. Current severity, 10 out of 10 left breast pain. The left breast has been painful, red, warm. No modifying factors. She denies fevers, chills, nausea vomiting or diarrhea.     Past Medical History  Diagnosis Date  . Pneumonia   . Bronchitis     hx of  . Anxiety   . Migraine     hx of    Patient Active Problem List   Diagnosis Date Noted  . Breast abscess 05/11/2014    Past Surgical History  Procedure Laterality Date  . Cervical colon biospy    . Knee arthroscopy      left  . Laparoscopic endometriosis fulguration      Current Outpatient Rx  Name  Route  Sig  Dispense  Refill  . dicloxacillin (DYNAPEN) 500 MG capsule   Oral   Take 500 mg by mouth 4 (four) times daily.         . diphenhydrAMINE (BENADRYL) 25 MG tablet   Oral   Take 25 mg by mouth at bedtime as needed. For sleep.         Marland Kitchen. ibuprofen (ADVIL,MOTRIN) 600 MG tablet   Oral   Take 600 mg by mouth every 6 (six) hours as needed for moderate pain.         . methadone (DOLOPHINE) 10 MG/ML solution   Oral   Take 60 mg by mouth daily.         . sertraline (ZOLOFT) 50 MG tablet    Oral   Take 50 mg by mouth daily.           Allergies Other; Bee venom; Doxycycline; Calamine; and Macrobid  No family history on file.  Social History History  Substance Use Topics  . Smoking status: Current Every Day Smoker -- 0.25 packs/day for 8 years  . Smokeless tobacco: Not on file  . Alcohol Use: Yes     Comment: occassional    Review of Systems  Constitutional: Negative for fever. Eyes: Negative for visual changes. ENT: Negative for sore throat. Cardiovascular: Negative for chest pain. Respiratory: Negative for shortness of breath. Gastrointestinal: Negative for abdominal pain, vomiting and diarrhea. Genitourinary: Negative for dysuria. Musculoskeletal: Negative for back pain; positive for left breast pain Skin: Negative for rash. Neurological: Negative for headaches, focal weakness or numbness.   10-point ROS otherwise negative.  ____________________________________________   PHYSICAL EXAM:  VITAL SIGNS: ED Triage Vitals  Enc Vitals Group     BP 05/11/14 1529 117/78 mmHg     Pulse Rate 05/11/14 1529 109     Resp --      Temp 05/11/14 1529 98.3 F (36.8 C)     Temp Source 05/11/14 1529 Oral     SpO2 05/11/14 1529 100 %  Weight 05/11/14 1529 170 lb (77.111 kg)     Height 05/11/14 1529  (1.727 m)     Head Cir --      Peak Flow --      Pain Score --      Pain Loc --      Pain Edu? --      Excl. in GC? --      Constitutional: Alert and oriented. Nontoxic appearing and in no distress. Eyes: Conjunctivae are normal. PERRL. Normal extraocular movements. ENT   Head: Normocephalic and atraumatic.   Nose: No congestion/rhinnorhea.   Mouth/Throat: Mucous membranes are moist.   Neck: No stridor. Hematological/Lymphatic/Immunilogical: No cervical lymphadenopathy. Cardiovascular: Tachycardic rate, regular rhythm. Normal and symmetric distal pulses are present in all extremities. No murmurs, rubs, or gallops. Respiratory: Normal  respiratory effort without tachypnea nor retractions. Breath sounds are clear and equal bilaterally. No wheezes/rales/rhonchi. Breast: Left breast is swollen with induration, erythema, additionally there is a 2-3 cm mass with fluctuance superior to the left nipple Gastrointestinal: Soft and nontender. No distention. No abdominal bruits. There is no CVA tenderness. Musculoskeletal: Nontender with normal range of motion in all extremities. No joint effusions.  No lower extremity tenderness nor edema. Neurologic:  Normal speech and language. No gross focal neurologic deficits are appreciated. Speech is normal. No gait instability. Skin:  Skin is warm, dry and intact. No rash noted. Psychiatric: Mood and affect are normal. Speech and behavior are normal. Patient exhibits appropriate insight and judgment.  ____________________________________________   EKG  None ____________________________________________    RADIOLOGY  Breast ultrasound pending  ____________________________________________   PROCEDURES  Procedure(s) performed: None  Critical Care performed: Yes, see critical care note(s). Total critical care time spent 40 minutes  ____________________________________________   INITIAL IMPRESSION / ASSESSMENT AND PLAN / ED COURSE  Pertinent labs & imaging results that were available during my care of the patient were reviewed by me and considered in my medical decision making (see chart for details).  Megan Hopkins is a 29 y.o. female with history of anxiety and migraines who presents for evaluation of left breast swelling. On exam, she is tearful, nontoxic appearing. She has mastitis with concern for abscess. She is tachycardic with leukocytosis meeting septic criteria, IV fluids ordered, IV vancomycin ordered. Discussed with Dr. Tiburcio Pea of OB/GYN. Discussed with Dr. Egbert Garibaldi of Gen. surgery who will consult. Ultrasound breast pending.  ----------------------------------------- 7:09  PM on 05/11/2014 ----------------------------------------- Tachycardia resolved. 30 mL/kg ordered normal saline ordered. Dr. Egbert Garibaldi of Gen. surgery admitting.   ____________________________________________   FINAL CLINICAL IMPRESSION(S) / ED DIAGNOSES  Final diagnoses:  Swelling  Breast abscess  Sepsis, due to unspecified organism     Gayla Doss, MD 05/11/14 1910

## 2014-05-11 NOTE — Consult Note (Signed)
PREGNANCY and LABOR:  Gravida 2   Term Deliveries 0   Preterm Deliveries 0   Abortions 1   Living Children 0   Final EDD (dd-mmm-yy) 12-May-2014   Blood Type (Maternal) O negative   Antibody Screen Results (Maternal) negative   Gonorrhea Results (Maternal) negative   Chlamydia Results (Maternal) negative   VDRL/RPR/Syphilis Results (Maternal) negative   Rubella Results (Maternal) immune   Hepatitis B Surface Antigen Results (Maternal) negative   Group B Strep Results Maternal (Result >5wks must be treated as unknown) unknown/result > 5 weeks ago  swab obtained and sent to lab    Additional Comments I met with Megan Hopkins, who is currently [redacted] weeks gestation with IV heroin use during her pregnancy as recently as two days ago.  She has disclosed this information to her OB team and has requested assistance with entering a treatment program.  We discussed the medical approach to monitoring newborns for signs of withdrawal, which would include 5 days of NAS scoring in the hospital and subsequent treatment with morphine as indicated.  If the baby is otherwise clinically stable, he will be able to stay in the room with Megan Hopkins as a newborn nursery patient while undergoing NAS scoring.  If treatment is initiated, he will be admitted to the [facility].  Length of stay is difficult to predict, but weaning off morphine could require several weeks in the SCN.  Breastmilk is contraindicated given the maternal history of illicit drug use.  Social work will continue to follow Megan Hopkins and her son during the SCN stay.  All questions were answered.  I am happy to reconsult if new questions arise before anticipated discharge tomorrow.   -Joana Reamer, MD   Electronic Signatures: Rhona Raider (MD)  (Signed 07-Apr-16 17:46)  Authored: PREGNANCY and LABOR, ADDITIONAL COMMENTS   Last Updated: 07-Apr-16 17:46 by Rhona Raider (MD)

## 2014-05-11 NOTE — ED Notes (Signed)
Pt states that she started b-dynafen last Tuesday, antibiotic for mastitis to the left breast, pt has a 4021 day old, denies nursing or pumping. Pt states that the infection has gotten worse and now has developed a quarter sized abscess to the top of the breast with large amount of foul smelling discharge coming from the nipple that is green in color. The left breast is swollen, red, and hard

## 2014-05-11 NOTE — H&P (Signed)
Megan Hopkins is an 29 y.o. female.   Chief Complaint: Left breast pain and swelling  HPI: 29 y.o female recently post partum presents with 1 week of left breast pain and swelling.  Being treated by GYN Dr Kenton Kingfisher for breast abscess.  She is not breast feeding. 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  Smokes 1/2 PPD, no recent ETOH, Mother at bedside and supportive.  Past Medical History  Diagnosis Date  . Pneumonia   . Bronchitis     hx of  . Anxiety   . Migraine     hx of    Past Surgical History  Procedure Laterality Date  . Cervical colon biospy    . Knee arthroscopy      left  . Laparoscopic endometriosis fulguration      No family history on file. Social History:  reports that she has been smoking.  She does not have any smokeless tobacco history on file. She reports that she drinks alcohol. She reports that she does not use illicit drugs.  Allergies:  Allergies  Allergen Reactions  . Other Anaphylaxis    Horseradish.  . Bee Venom Hives and Swelling  . Doxycycline Hives and Nausea And Vomiting  . Calamine Hives  . Macrobid [Nitrofurantoin Monohyd Macro] Rash     (Not in a hospital admission)  Results for orders placed or performed during the hospital encounter of 05/11/14 (from the past 48 hour(s))  CBC with Differential     Status: Abnormal   Collection Time: 05/11/14  3:45 PM  Result Value Ref Range   WBC 12.5 (H) 3.6 - 11.0 K/uL   RBC 4.08 3.80 - 5.20 MIL/uL   Hemoglobin 10.9 (L) 12.0 - 16.0 g/dL   HCT 33.5 (L) 35.0 - 47.0 %   MCV 82.1 80.0 - 100.0 fL   MCH 26.7 26.0 - 34.0 pg   MCHC 32.5 32.0 - 36.0 g/dL   RDW 14.6 (H) 11.5 - 14.5 %   Platelets 452 (H) 150 - 440 K/uL   Neutrophils Relative % 65% %   Neutro Abs 8.1 (H) 1.4 - 6.5 K/uL   Lymphocytes Relative 18% %   Lymphs Abs 2.3 1.0 - 3.6 K/uL   Monocytes Relative 9% %   Monocytes Absolute 1.1 (H) 0.2 - 0.9 K/uL   Eosinophils Relative 8% %   Eosinophils Absolute 1.0 (H)  0 - 0.7 K/uL   Basophils Relative 0% %   Basophils Absolute 0.1 0 - 0.1 K/uL  Comprehensive metabolic panel     Status: Abnormal   Collection Time: 05/11/14  3:45 PM  Result Value Ref Range   Sodium 141 135 - 145 mmol/L   Potassium 4.3 3.5 - 5.1 mmol/L   Chloride 105 101 - 111 mmol/L   CO2 28 22 - 32 mmol/L   Glucose, Bld 76 65 - 99 mg/dL   BUN 11 6 - 20 mg/dL   Creatinine, Ser 0.83 0.44 - 1.00 mg/dL   Calcium 9.0 8.9 - 10.3 mg/dL   Total Protein 7.5 6.5 - 8.1 g/dL   Albumin 3.0 (L) 3.5 - 5.0 g/dL   AST 19 15 - 41 U/L   ALT 17 14 - 54 U/L   Alkaline Phosphatase 94 38 - 126 U/L   Total Bilirubin 0.2 (L) 0.3 - 1.2 mg/dL   GFR calc non Af Amer >60 >60 mL/min   GFR calc Af Amer >60 >60 mL/min    Comment: (NOTE) The eGFR has  been calculated using the CKD EPI equation. This calculation has not been validated in all clinical situations. eGFR's persistently <90 mL/min signify possible Chronic Kidney Disease.    Anion gap 8 5 - 15   No results found.  Review of Systems  Constitutional: Positive for fever, malaise/fatigue and diaphoresis.  HENT: Negative.   Eyes: Negative.   Cardiovascular: Negative.   Gastrointestinal: Negative.   Genitourinary: Negative.   Skin: Negative.        Left breast pain and swelling.  Neurological: Negative.   Endo/Heme/Allergies: Negative.   Psychiatric/Behavioral: Negative.     Blood pressure 117/78, pulse 109, temperature 98.3 F (36.8 C), temperature source Oral, height 5' 8"  (1.727 m), weight 77.111 kg (170 lb), SpO2 99 %. Physical Exam  Constitutional: She is oriented to person, place, and time. She appears well-developed. She appears distressed.  HENT:  Head: Normocephalic and atraumatic.  Eyes: Pupils are equal, round, and reactive to light.  Neck: Normal range of motion.  Cardiovascular: Normal rate and regular rhythm.  Exam reveals no gallop.   No murmur heard. Respiratory: Effort normal.  GI: Soft.  Neurological: She is oriented  to person, place, and time.  Skin: Skin is warm.     Psychiatric: Her speech is normal and behavior is normal. Thought content normal. Her mood appears anxious.     Assessment/Plan  Admit, IV cipro plan I and D Monday,  Discussed percutaneous drainage and IV abx but given appearance of collection on ultrasound she would best be treated with open surgery and drainage.  Mahasin Riviere, [place] 05/11/2014, 6:45 PM

## 2014-05-12 ENCOUNTER — Encounter
Admission: EM | Disposition: A | Discharge status: Home / Self Care | Payer: Self-pay | Source: Home / Self Care | Attending: Student

## 2014-05-12 ENCOUNTER — Observation Stay: Payer: Medicaid Other | Admitting: Anesthesiology

## 2014-05-12 HISTORY — PX: INCISION AND DRAINAGE ABSCESS: SHX5864

## 2014-05-12 LAB — URINE DRUG SCREEN, QUALITATIVE (ARMC ONLY)
Amphetamines, Ur Screen: NOT DETECTED
Barbiturates, Ur Screen: NOT DETECTED
Benzodiazepine, Ur Scrn: NOT DETECTED
CANNABINOID 50 NG, UR ~~LOC~~: NOT DETECTED
Cocaine Metabolite,Ur ~~LOC~~: NOT DETECTED
MDMA (Ecstasy)Ur Screen: NOT DETECTED
METHADONE SCREEN, URINE: POSITIVE — AB
Opiate, Ur Screen: POSITIVE — AB
Phencyclidine (PCP) Ur S: NOT DETECTED
Tricyclic, Ur Screen: NOT DETECTED

## 2014-05-12 LAB — PREGNANCY, URINE: PREG TEST UR: NEGATIVE

## 2014-05-12 SURGERY — INCISION AND DRAINAGE, ABSCESS
Anesthesia: General | Site: Breast | Laterality: Left | Wound class: Dirty or Infected

## 2014-05-12 MED ORDER — HYDROMORPHONE HCL 1 MG/ML IJ SOLN
0.2500 mg | INTRAMUSCULAR | Status: DC | PRN
  Administered 2014-05-12 (×4): 0.5 mg via INTRAVENOUS

## 2014-05-12 MED ORDER — MORPHINE SULFATE 4 MG/ML IJ SOLN
INTRAMUSCULAR | Status: AC
  Administered 2014-05-12: 4 mg via INTRAVENOUS
  Filled 2014-05-12: qty 1

## 2014-05-12 MED ORDER — MIDAZOLAM HCL 2 MG/2ML IJ SOLN
INTRAMUSCULAR | Status: DC | PRN
  Administered 2014-05-12: 2 mg via INTRAVENOUS

## 2014-05-12 MED ORDER — CEFTRIAXONE SODIUM IN DEXTROSE 20 MG/ML IV SOLN
1.0000 g | INTRAVENOUS | Status: DC
  Administered 2014-05-12: 1 g via INTRAVENOUS
  Filled 2014-05-12 (×2): qty 50

## 2014-05-12 MED ORDER — VANCOMYCIN HCL IN DEXTROSE 1-5 GM/200ML-% IV SOLN
1000.0000 mg | Freq: Three times a day (TID) | INTRAVENOUS | Status: DC
  Administered 2014-05-13 (×2): 1000 mg via INTRAVENOUS
  Filled 2014-05-12 (×5): qty 200

## 2014-05-12 MED ORDER — MORPHINE SULFATE 4 MG/ML IJ SOLN
4.0000 mg | INTRAMUSCULAR | Status: DC | PRN
  Administered 2014-05-12 – 2014-05-15 (×41): 4 mg via INTRAVENOUS
  Filled 2014-05-12 (×40): qty 1

## 2014-05-12 MED ORDER — CIPROFLOXACIN IN D5W 400 MG/200ML IV SOLN
400.0000 mg | Freq: Once | INTRAVENOUS | Status: DC
  Administered 2014-05-12: 400 mg via INTRAVENOUS
  Filled 2014-05-12: qty 200

## 2014-05-12 MED ORDER — CIPROFLOXACIN IN D5W 400 MG/200ML IV SOLN: 400.0000 mg | Freq: Two times a day (BID) | INTRAVENOUS | Status: DC

## 2014-05-12 MED ORDER — ONDANSETRON HCL 4 MG/2ML IJ SOLN
INTRAMUSCULAR | Status: DC | PRN
  Administered 2014-05-12: 4 mg via INTRAVENOUS

## 2014-05-12 MED ORDER — LACTATED RINGERS IV SOLN
INTRAVENOUS | Status: DC | PRN
  Administered 2014-05-12: 15:00:00 via INTRAVENOUS

## 2014-05-12 MED ORDER — ONDANSETRON HCL 4 MG/2ML IJ SOLN: 4.0000 mg | Freq: Once | INTRAMUSCULAR | Status: DC | PRN

## 2014-05-12 MED ORDER — FENTANYL CITRATE (PF) 100 MCG/2ML IJ SOLN
INTRAMUSCULAR | Status: DC | PRN
  Administered 2014-05-12: 50 ug via INTRAVENOUS
  Administered 2014-05-12: 100 ug via INTRAVENOUS

## 2014-05-12 MED ORDER — VANCOMYCIN HCL IN DEXTROSE 1-5 GM/200ML-% IV SOLN
1000.0000 mg | INTRAVENOUS | Status: AC
  Administered 2014-05-11: 1 g via INTRAVENOUS
  Administered 2014-05-12: 1000 mg via INTRAVENOUS
  Filled 2014-05-12: qty 200

## 2014-05-12 MED ORDER — FENTANYL CITRATE (PF) 100 MCG/2ML IJ SOLN
25.0000 ug | INTRAMUSCULAR | Status: AC | PRN
  Administered 2014-05-12 (×4): 25 ug via INTRAVENOUS

## 2014-05-12 MED ORDER — CLINDAMYCIN PHOSPHATE 600 MG/50ML IV SOLN
600.0000 mg | Freq: Three times a day (TID) | INTRAVENOUS | Status: DC
  Administered 2014-05-12 – 2014-05-13 (×3): 600 mg via INTRAVENOUS
  Filled 2014-05-12 (×6): qty 50

## 2014-05-12 MED ORDER — ONDANSETRON HCL 4 MG/2ML IJ SOLN: 4.0000 mg | Freq: Once | INTRAMUSCULAR | Status: AC | PRN

## 2014-05-12 MED ORDER — DEXAMETHASONE SODIUM PHOSPHATE 4 MG/ML IJ SOLN
INTRAMUSCULAR | Status: DC | PRN
  Administered 2014-05-12: 5 mg via INTRAVENOUS

## 2014-05-12 MED ORDER — PROPOFOL 10 MG/ML IV BOLUS
INTRAVENOUS | Status: DC | PRN
  Administered 2014-05-12: 200 mg via INTRAVENOUS
  Administered 2014-05-12: 50 mg via INTRAVENOUS

## 2014-05-12 SURGICAL SUPPLY — 33 items
ADHESIVE MASTISOL STRL (MISCELLANEOUS) IMPLANT
BLADE SURG 15 STRL LF DISP TIS (BLADE) ×1 IMPLANT
BLADE SURG 15 STRL SS (BLADE) ×2
BNDG COHESIVE 4X5 WHT NS (GAUZE/BANDAGES/DRESSINGS) IMPLANT
BNDG STRETCH 4X75 STRL LF (GAUZE/BANDAGES/DRESSINGS) ×3 IMPLANT
CANISTER SUCT 1200ML W/VALVE (MISCELLANEOUS) ×3 IMPLANT
CHLORAPREP W/TINT 26ML (MISCELLANEOUS) IMPLANT
DRAPE LAPAROTOMY TRNSV 106X77 (MISCELLANEOUS) ×3 IMPLANT
ELECT CAUTERY BLADE TIP 2.5 (TIP) ×3
ELECTRODE CAUTERY BLDE TIP 2.5 (TIP) ×1 IMPLANT
GAUZE FLUFF 18X24 1PLY STRL (GAUZE/BANDAGES/DRESSINGS) ×3 IMPLANT
GAUZE SPONGE NON-WVN 2X2 STRL (MISCELLANEOUS) IMPLANT
GAUZE STRETCH 2X75IN STRL (MISCELLANEOUS) ×6 IMPLANT
GLOVE BIO SURGEON STRL SZ7.5 (GLOVE) IMPLANT
GLOVE BIO SURGEON STRL SZ8 (GLOVE) ×6 IMPLANT
GLOVE INDICATOR 8.0 STRL GRN (GLOVE) ×6 IMPLANT
GOWN STRL REUS W/ TWL LRG LVL3 (GOWN DISPOSABLE) ×2 IMPLANT
GOWN STRL REUS W/ TWL XL LVL3 (GOWN DISPOSABLE) IMPLANT
GOWN STRL REUS W/TWL LRG LVL3 (GOWN DISPOSABLE) ×4
GOWN STRL REUS W/TWL XL LVL3 (GOWN DISPOSABLE)
LABEL OR SOLS (LABEL) IMPLANT
NDL SAFETY 22GX1.5 (NEEDLE) IMPLANT
NDL SAFETY 25GX1.5 (NEEDLE) IMPLANT
PACK BASIN MINOR ARMC (MISCELLANEOUS) ×3 IMPLANT
PAD GROUND ADULT SPLIT (MISCELLANEOUS) ×3 IMPLANT
SPONGE LAP 18X18 5 PK (GAUZE/BANDAGES/DRESSINGS) ×3 IMPLANT
SPONGE VERSALON 2X2 STRL (MISCELLANEOUS)
STRIP SUTURE WOUND CLOSURE 1/2 (SUTURE) IMPLANT
SUT MNCRL AB 4-0 PS2 18 (SUTURE) IMPLANT
SUT VIC AB 3-0 SH 27 (SUTURE)
SUT VIC AB 3-0 SH 27X BRD (SUTURE) IMPLANT
SYRINGE 10CC LL (SYRINGE) IMPLANT
WATER STERILE IRR 1000ML POUR (IV SOLUTION) ×3 IMPLANT

## 2014-05-12 NOTE — Anesthesia Preprocedure Evaluation (Signed)
Anesthesia Evaluation  Patient identified by MRN, date of birth, ID band Patient awake    Reviewed: Allergy & Precautions, NPO status , Patient's Chart, lab work & pertinent test results  History of Anesthesia Complications Negative for: history of anesthetic complications  Airway Mallampati: I       Dental no notable dental hx.    Pulmonary Current Smoker,    Pulmonary exam normal       Cardiovascular Exercise Tolerance: Good negative cardio ROS      Neuro/Psych  Headaches, PSYCHIATRIC DISORDERS Anxiety    GI/Hepatic negative GI ROS, Neg liver ROS,   Endo/Other  negative endocrine ROS  Renal/GU negative Renal ROS     Musculoskeletal   Abdominal   Peds  Hematology negative hematology ROS (+)   Anesthesia Other Findings   Reproductive/Obstetrics                             Anesthesia Physical Anesthesia Plan  ASA: III  Anesthesia Plan: General   Post-op Pain Management:    Induction: Intravenous  Airway Management Planned: LMA  Additional Equipment:   Intra-op Plan:   Post-operative Plan: Extubation in OR  Informed Consent: I have reviewed the patients History and Physical, chart, labs and discussed the procedure including the risks, benefits and alternatives for the proposed anesthesia with the patient or authorized representative who has indicated his/her understanding and acceptance.   Dental advisory given  Plan Discussed with: CRNA and Surgeon  Anesthesia Plan Comments:         Anesthesia Quick Evaluation

## 2014-05-12 NOTE — Anesthesia Procedure Notes (Signed)
Procedure Name: LMA Insertion Performed by: Edyth GunnelsGILBERT, [place] Buckalew Pre-anesthesia Checklist: Patient identified Patient Re-evaluated:Patient Re-evaluated prior to inductionOxygen Delivery Method: Circle system utilized Preoxygenation: Pre-oxygenation with 100% oxygen Intubation Type: IV induction Ventilation: Mask ventilation without difficulty LMA Size: 3.5 Tube secured with: Tape

## 2014-05-12 NOTE — Progress Notes (Signed)
ANTIBIOTIC CONSULT NOTE - INITIAL  Pharmacy Consult for Megan Hopkins, Megan Hopkins Indication: Breast abscess  Allergies  Allergen Reactions  . Other Anaphylaxis    Horseradish.  . Bee Venom Hives and Swelling  . Doxycycline Hives and Nausea And Vomiting  . Calamine Hives  . Macrobid [Nitrofurantoin Monohyd Macro] Rash    Patient Measurements: Height:  (172.7 cm) Weight: 176 lb 6 oz (80.003 kg) IBW/kg (Calculated) : 63.9 Adjusted Body Weight: 70 kg  Vital Signs: Temp: 98.6 F (37 C) (05/02 1815) Temp Source: Tympanic (05/02 1534) BP: 131/69 mmHg (05/02 1815) Pulse Rate: 86 (05/02 1815) Intake/Output from previous day: 05/01 0701 - 05/02 0700 In: 1909 [P.O.:360; I.V.:1149; IV Piggyback:400] Out: 1800 [Urine:1800] Intake/Output from this shift: Total I/O In: 1761 [I.V.:1761] Out: 1900 [Urine:1900]  Labs:  Recent Labs  05/11/14 1545  WBC 12.5*  HGB 10.9*  PLT 452*  CREATININE 0.83   Estimated Creatinine Clearance: 112 mL/min (by C-G formula based on Cr of 0.83). No results for input(s): VANCOTROUGH, VANCOPEAK, VANCORANDOM, GENTTROUGH, GENTPEAK, GENTRANDOM, TOBRATROUGH, TOBRAPEAK, TOBRARND, AMIKACINPEAK, AMIKACINTROU, AMIKACIN in the last 72 hours.   Microbiology: Recent Results (from the past 720 hour(s))  Urine culture     Status: None   Collection Time: 04/16/14  2:21 PM  Result Value Ref Range Status   Micro Text Report   Final       SOURCE: CLEAN CATCH    COMMENT                   MIXED BACTERIAL ORGANISMS   COMMENT                   RESULTS SUGGESTIVE OF CONTAMINATION   ANTIBIOTIC                                                      Beta Strep Culture [facility])     Status: None   Collection Time: 04/16/14  6:55 PM  Result Value Ref Range Status   Micro Text Report   Final       SOURCE: GENITAL    COMMENT                   NO BETA STREPTOCOCCUS ISOLATED IN 48 HOURS   ANTIBIOTIC                                                      GC/Chlamydia Probe  Amp     Status: None   Collection Time: 04/16/14  6:55 PM  Result Value Ref Range Status   Micro Text Report   Final       CHLAMYDIA                 CHLAMYDIA TRACHOMATIS NEGATIVE   N.GONORRHOEAE             N.GONORRHOEAE NEGATIVE   ANTIBIOTIC  Blood culture (routine x 2)     Status: None (Preliminary result)   Collection Time: 05/11/14  3:45 PM  Result Value Ref Range Status   Specimen Description BLOOD  Final   Special Requests NONE  Final   Culture NO GROWTH < 24 HOURS  Final   Report Status PENDING  Incomplete    Medical History: Past Medical History  Diagnosis Date  . Pneumonia   . Bronchitis     hx of  . Anxiety   . Migraine     hx of    Medications:  Anti-infectives    Start     Dose/Rate Route Frequency Ordered Stop   05/13/14 0130  vancomycin (VANCOCIN) IVPB 1000 mg/200 mL premix     1,000 mg 200 mL/hr over 60 Minutes Intravenous Every 8 hours 05/12/14 1833     05/12/14 2200  clindamycin (CLEOCIN) IVPB 600 mg     600 mg 100 mL/hr over 30 Minutes Intravenous 3 times per day 05/12/14 1750     05/12/14 1845  vancomycin (VANCOCIN) IVPB 1000 mg/200 mL premix     1,000 mg 200 mL/hr over 60 Minutes Intravenous STAT 05/12/14 1833 05/13/14 1845   05/12/14 1800  cefTRIAXone (ROCEPHIN) 1 g in dextrose 5 % 50 mL IVPB - Premix     1 g 100 mL/hr over 30 Minutes Intravenous Every 24 hours 05/12/14 1750     05/12/14 1515  ciprofloxacin (CIPRO) IVPB 400 mg  Status:  Discontinued     400 mg 200 mL/hr over 60 Minutes Intravenous Every 12 hours 05/12/14 1509 05/12/14 1511   05/12/14 1515  ciprofloxacin (CIPRO) IVPB 400 mg  Status:  Discontinued     400 mg 200 mL/hr over 60 Minutes Intravenous  Once 05/12/14 1511 05/12/14 1653   05/11/14 1845  ciprofloxacin (CIPRO) IVPB 400 mg  Status:  Discontinued     400 mg 200 mL/hr over 60 Minutes Intravenous Every 12 hours 05/11/14 1842 05/12/14 1551   05/11/14 1801  vancomycin  (VANCOCIN) 1 GM/200ML IVPB    Comments:  NEAL, SUSAN: cabinet override      05/11/14 1801 05/11/14 1945   05/11/14 1800  vancomycin (VANCOCIN) injection 1,000 mg     1,000 mg Intravenous  Once 05/11/14 1749 05/11/14 1838     Assessment: Patient ordered vancomycin and clindamycin for cellulitis/abscess of breast.   Goal of Therapy:  Vancomycin trough level 15-20 mcg/ml  Plan:  Measure antibiotic drug levels at steady state Follow up culture results Vancomycin 1000 mg iv q 8 h with stacked dosing and a trough with the 4th dose ordered.   Luisa Harthristy, Lisa Blakeman D 05/12/2014,6:33 PM

## 2014-05-12 NOTE — Anesthesia Postprocedure Evaluation (Signed)
  Anesthesia Post-op Note  Patient: Megan Hopkins  Procedure(s) Performed: Procedure(s): INCISION AND DRAINAGE ABSCESS (Left)  Anesthesia type:General  Patient location: PACU  Post pain: Pain level controlled  Post assessment: Post-op Vital signs reviewed, Patient's Cardiovascular Status Stable, Respiratory Function Stable, Patent Airway and No signs of Nausea or vomiting  Post vital signs: Reviewed and stable  Last Vitals:  Filed Vitals:   05/12/14 1534  BP: 115/72  Pulse: 82  Temp: 37.3 C  Resp: 13    Level of consciousness: awake, alert  and patient cooperative  Complications: No apparent anesthesia complications

## 2014-05-12 NOTE — Progress Notes (Signed)
Report [place]

## 2014-05-12 NOTE — Transfer of Care (Signed)
Immediate Anesthesia Transfer of Care Note  Patient: Megan Hopkins  Procedure(s) Performed: Procedure(s): INCISION AND DRAINAGE ABSCESS (Left)  Patient Location: PACU  Anesthesia Type:General  Level of Consciousness: awake, alert  and oriented  Airway & Oxygen Therapy: Patient Spontanous Breathing  Post-op Assessment: Report given to RN and Post -op Vital signs reviewed and stable  Post vital signs: Reviewed and stable  Last Vitals:  Filed Vitals:   05/12/14 1534  BP: 115/72  Pulse: 82  Temp: 37.3 C  Resp: 13    Complications: No apparent anesthesia complications

## 2014-05-12 NOTE — Op Note (Signed)
Brief Op Note  Preoperative Diagnosis: Left breast abscess   Postoperative Diagnosis: Same  Operation Performed: Incision and Drainage Left Breast Abscess   Date of Procedure: 05/12/2014   Surgeon: Marlynn PerkingW. F. Coreon Simkins, Jr., M.D.  Assistant: None  Anesthesia: General Endotracheal  Estimated Blood Loss: 10 ml         Specimen(s): tissue Cx            Complications: None; the patient tolerated the procedure well.

## 2014-05-12 NOTE — Op Note (Signed)
[name], Megan               ACCOUNT NO.:  [id number]  MEDICAL RECORD NO.:  [id number]  LOCATION:  350A                         FACILITY:  [facility]  PHYSICIAN:  Duwaine MaxinWilliam Croy Drumwright, MD   DATE OF BIRTH:  [birth year]  DATE OF PROCEDURE:  05/12/2014 DATE OF DISCHARGE:                              OPERATIVE REPORT   PREOPERATIVE DIAGNOSIS:  Left breast abscess.  POSTOPERATIVE DIAGNOSIS:  Left breast abscess.  ANESTHESIA:  General.  PROCEDURE PERFORMED:  Incision and drainage of complex left breast abscess.  SURGEON:  Duwaine MaxinWilliam Pascal Stiggers, MD  DESCRIPTION OF PROCEDURE:  The patient was prepped and draped in the usual sterile fashion.  An elliptical incision was made over the necrotic skin that was in the upper outer quadrant just about 1 cm lateral to the areolar edge.  This included all of the necrotic skin and was carried down through the skin, into the subcutaneous tissue and into the cellulitic breast tissue.  A significant amount of pus was drained. There were a couple of interdigitating areas that led to more drainage, and the subcutaneous and breast tissue were then opened with electrocautery.  Hemostasis was achieved with the electrocautery and the abscess cavity was packed with 2-inch Kling that was soaked in sterile normal saline.  A dry and compressive dressing was applied on top of this.  The patient tolerated the procedure well.  There were no complications.          ______________________________ Duwaine MaxinWilliam Zaydon Kinser, MD     WM/MEDQ  D:  05/12/2014  T:  05/12/2014  Job:  [id number]  cc:   Duwaine MaxinWilliam Elika Godar, MD

## 2014-05-13 MED ORDER — CEPHALEXIN 500 MG PO CAPS
500.0000 mg | ORAL_CAPSULE | Freq: Four times a day (QID) | ORAL | Status: DC
  Administered 2014-05-13 – 2014-05-15 (×9): 500 mg via ORAL
  Filled 2014-05-13 (×9): qty 1

## 2014-05-13 NOTE — Progress Notes (Signed)
1 Day Post-Op   Subjective: Feels much much better.  Vital signs in last 24 hours: Temp:  [97.8 F (36.6 C)-98.6 F (37 C)] 97.8 F (36.6 C) (05/03 1053) Pulse Rate:  [69-97] 72 (05/03 1053) Resp:  [16-18] 18 (05/03 1053) BP: (105-140)/(57-86) 138/83 mmHg (05/03 1053) SpO2:  [95 %-98 %] 98 % (05/03 1053)    Intake/Output from previous day: 05/02 0701 - 05/03 0700 In: 2061 [I.V.:1761; IV Piggyback:300] Out: 2850 [Urine:2850]  Breast: erythema and edema markedly improved, but not resolved completely  Lab Results:  CBC  Recent Labs  05/11/14 1545  WBC 12.5*  HGB 10.9*  HCT 33.5*  PLT 452*   CMP     Component Value Date/Time   NA 141 05/11/2014 1545   NA 137 04/17/2014 0542   K 4.3 05/11/2014 1545   K 3.5 04/17/2014 0542   CL 105 05/11/2014 1545   CL 106 04/17/2014 0542   CO2 28 05/11/2014 1545   CO2 22 04/17/2014 0542   GLUCOSE 76 05/11/2014 1545   GLUCOSE 102* 04/17/2014 0542   BUN 11 05/11/2014 1545   BUN 8 04/17/2014 0542   CREATININE 0.83 05/11/2014 1545   CREATININE 0.55 04/17/2014 0542   CALCIUM 9.0 05/11/2014 1545   CALCIUM 8.0* 04/17/2014 0542   PROT 7.5 05/11/2014 1545   PROT 6.1* 04/17/2014 0542   ALBUMIN 3.0* 05/11/2014 1545   ALBUMIN 2.3* 04/17/2014 0542   AST 19 05/11/2014 1545   AST 34 04/17/2014 0542   ALT 17 05/11/2014 1545   ALT 32 04/17/2014 0542   ALKPHOS 94 05/11/2014 1545   ALKPHOS 144* 04/17/2014 0542   BILITOT 0.2* 05/11/2014 1545   GFRNONAA >60 05/11/2014 1545   GFRNONAA >60 04/17/2014 0542   GFRAA >60 05/11/2014 1545   GFRAA >60 04/17/2014 0542   PT/INR No results for input(s): LABPROT, INR in the last 72 hours.  Studies/Results: Cx with Group F Strept  Assessment/Plan: 1/8 of packing removed, and Telfa added. D/C IV ABx and switch to PO Keflex

## 2014-05-14 MED ORDER — MORPHINE SULFATE 4 MG/ML IJ SOLN
INTRAMUSCULAR | Status: AC
  Filled 2014-05-14: qty 1

## 2014-05-14 MED ORDER — MORPHINE SULFATE 4 MG/ML IJ SOLN
6.0000 mg | Freq: Once | INTRAMUSCULAR | Status: AC
  Administered 2014-05-14: 6 mg via INTRAVENOUS

## 2014-05-14 MED ORDER — MORPHINE SULFATE 4 MG/ML IJ SOLN
INTRAMUSCULAR | Status: AC
  Filled 2014-05-14: qty 2

## 2014-05-14 NOTE — Plan of Care (Signed)
Problem: Phase III Progression Outcomes Goal: Wound care performed by pt/family Outcome: Progressing Reviewed instructions for dressing changes and shower care, which patient intends to do in the morning.  Patient's mother will be assisting her at home.

## 2014-05-14 NOTE — Progress Notes (Signed)
2 Days Post-Op   Subjective: Feels better Mom bought a sports bra with zipper front  Vital signs in last 24 hours: Temp:  [97.5 F (36.4 C)-98.5 F (36.9 C)] 97.8 F (36.6 C) (05/04 0730) Pulse Rate:  [65-80] 65 (05/04 0730) Resp:  [18-20] 20 (05/04 0730) BP: (124-161)/(82-95) 143/90 mmHg (05/04 0730) SpO2:  [96 %-100 %] 98 % (05/04 0730)    Intake/Output from previous day: 05/03 0701 - 05/04 0700 In: 2761.7 [I.V.:2761.7] Out: 3700 [Urine:3700]  Breast markedly improved (almost no cellulitis now)  Lab Results:  CBC  Recent Labs  05/11/14 1545  WBC 12.5*  HGB 10.9*  HCT 33.5*  PLT 452*   CMP     Component Value Date/Time   NA 141 05/11/2014 1545   NA 137 04/17/2014 0542   K 4.3 05/11/2014 1545   K 3.5 04/17/2014 0542   CL 105 05/11/2014 1545   CL 106 04/17/2014 0542   CO2 28 05/11/2014 1545   CO2 22 04/17/2014 0542   GLUCOSE 76 05/11/2014 1545   GLUCOSE 102* 04/17/2014 0542   BUN 11 05/11/2014 1545   BUN 8 04/17/2014 0542   CREATININE 0.83 05/11/2014 1545   CREATININE 0.55 04/17/2014 0542   CALCIUM 9.0 05/11/2014 1545   CALCIUM 8.0* 04/17/2014 0542   PROT 7.5 05/11/2014 1545   PROT 6.1* 04/17/2014 0542   ALBUMIN 3.0* 05/11/2014 1545   ALBUMIN 2.3* 04/17/2014 0542   AST 19 05/11/2014 1545   AST 34 04/17/2014 0542   ALT 17 05/11/2014 1545   ALT 32 04/17/2014 0542   ALKPHOS 94 05/11/2014 1545   ALKPHOS 144* 04/17/2014 0542   BILITOT 0.2* 05/11/2014 1545   GFRNONAA >60 05/11/2014 1545   GFRNONAA >60 04/17/2014 0542   GFRAA >60 05/11/2014 1545   GFRAA >60 04/17/2014 0542   PT/INR No results for input(s): LABPROT, INR in the last 72 hours.  Studies/Results: No results found.  Assessment/Plan: Remainder of packing removed. Instructed in shower with water into and out of wound, followed by 4x4 in through skin, covered by 4x4s , telfa, and sports bra. She will attempt to do this in the hospital tomorrow AM, in anticipation of possible D/C.

## 2014-05-15 MED ORDER — CEPHALEXIN 500 MG PO CAPS: 500.0000 mg | ORAL_CAPSULE | Freq: Four times a day (QID) | ORAL | Status: DC

## 2014-05-15 NOTE — Progress Notes (Signed)
Patient had some control of pain using IV and PO medications.  Able to sleep between doses, but still awakened within a two-hour period and called out requesting something for pain.  Ambulating to the BR without difficulty.  Reviewed plan for shower and dressing change at the beginning of day shift.  Patient hopes to go home today.  Shift report given to eBayChristie Cooper.

## 2014-05-15 NOTE — Progress Notes (Signed)
Clinical Social Worker met with Pt to review plan for "rooming-in" with son in SCN once medically discharged. Pt is excited and ready to begin rooming in process. Pt verbally acknowledged with CSW that she will not be getting medications from [facility] once discharged. Pt's mother will be able to transport her to methadone clinic in the morning like usual. CSW updated RN who will update MD when he rounds. CSW updated SCN RN with plan as well.  RNs will coordinate discharge and rooming in process when appropriate.   CSW will provide letter verifying Pt's stay for methadone clinic per her request.    CSW will continue to support Pt and her son in SCN as needed.   Toma Copier, [place]

## 2014-05-15 NOTE — Plan of Care (Signed)
Problem: Discharge Progression Outcomes Goal: Pain controlled with appropriate interventions Outcome: Progressing Reviewed pain management with patient via IV and PO medications.

## 2014-05-15 NOTE — Discharge Summary (Signed)
Patient ID: Megan Hopkins MRN: [id number] DOB/AGE: April 21, 1985 28 y.o.  Admit date: 05/11/2014 Discharge date: 05/15/2014  Discharge Diagnoses:  Left breast abscess and mastitis  Procedures Performed: Incision and drainage of complex left breast abscess  Discharged Condition: good  Hospital Course: The patient was admitted to the hospital and put on IV antibiotics and taken to the operating room where she underwent the above-mentioned procedure for the above-mentioned diagnosis. Over the course of the next 2 days and removed her packing and replaced it with a corner of a 4 x 4 gauze. The following morning (today) the patient was able to take a shower, remove her gauze, and replace her gauze herself with a dressing change once she got out of the shower.  Discharge Orders:  4 x 4 gauze dressing change as instructed daily with shower  Disposition: 02-Short Term Hospital  Discharge Medications:  Current facility-administered medications:  .  cephALEXin (KEFLEX) capsule 500 mg, 500 mg, Oral, 4 times per day, Duwaine MaxinWilliam Halcyon Heck, MD, 500 mg at 05/15/14 1804 .  dextrose 5 %-0.45 % sodium chloride infusion, , Intravenous, Continuous, Natale LayMark Bird, MD, Last Rate: 100 mL/hr at 05/15/14 1201 .  heparin injection 5,000 Units, 5,000 Units, Subcutaneous, 3 times per day, Natale LayMark Bird, MD, 5,000 Units at 05/15/14 1548 .  HYDROcodone-acetaminophen (NORCO/VICODIN) 5-325 MG per tablet 1-2 tablet, 1-2 tablet, Oral, Q4H PRN, Natale LayMark Bird, MD, 2 tablet at 05/15/14 1453 .  methadone (DOLOPHINE) 10 MG/ML solution 60 mg, 60 mg, Oral, Daily, Natale LayMark Bird, MD, 60 mg at 05/15/14 0914 .  morphine 4 MG/ML injection 4 mg, 4 mg, Intravenous, Q1H PRN, Duwaine MaxinWilliam Detra Bores, MD, 4 mg at 05/15/14 0846 .  sertraline (ZOLOFT) tablet 50 mg, 50 mg, Oral, Daily, Natale LayMark Bird, MD, 50 mg at 05/15/14 0846  Follwup: Follow-up Information    Follow up with Claude MangesWilliam F Ayce Pietrzyk, MD In 1 week.   Specialty:  Surgery   Contact information:   [id number] [place] [phone]       Signed: Claude MangesWilliam F Sheilah Rayos 05/15/2014, 6:27 PM

## 2014-05-15 NOTE — Progress Notes (Signed)
RN observed patient unpack and pack wound on left breast independently . She did well

## 2014-05-16 LAB — CULTURE, BLOOD (ROUTINE X 2): Culture: NO GROWTH

## 2014-05-18 LAB — CULTURE, ROUTINE-ABSCESS

## 2014-05-19 ENCOUNTER — Encounter: Payer: Self-pay | Admitting: Surgery
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
## 2016-03-18 ENCOUNTER — Emergency Department
Admission: EM | Admit: 2016-03-18 | Discharge: 2016-03-19 | Disposition: A | Discharge status: Home / Self Care | Payer: Medicaid Other | Attending: Emergency Medicine | Admitting: Emergency Medicine

## 2016-03-18 ENCOUNTER — Encounter: Payer: Self-pay | Admitting: *Deleted

## 2016-03-18 ENCOUNTER — Emergency Department: Payer: Medicaid Other

## 2016-03-18 DIAGNOSIS — R112 Nausea with vomiting, unspecified: Secondary | ICD-10-CM

## 2016-03-18 DIAGNOSIS — R1011 Right upper quadrant pain: Secondary | ICD-10-CM

## 2016-03-18 DIAGNOSIS — Z79899 Other long term (current) drug therapy: Secondary | ICD-10-CM | POA: Diagnosis not present

## 2016-03-18 DIAGNOSIS — K2900 Acute gastritis without bleeding: Secondary | ICD-10-CM | POA: Diagnosis not present

## 2016-03-18 DIAGNOSIS — F17210 Nicotine dependence, cigarettes, uncomplicated: Secondary | ICD-10-CM | POA: Diagnosis not present

## 2016-03-18 LAB — CBC
HEMATOCRIT: 42.9 % (ref 35.0–47.0)
Hemoglobin: 14.5 g/dL (ref 12.0–16.0)
MCH: 28.3 pg (ref 26.0–34.0)
MCHC: 33.7 g/dL (ref 32.0–36.0)
MCV: 83.9 fL (ref 80.0–100.0)
Platelets: 222 10*3/uL (ref 150–440)
RBC: 5.11 MIL/uL (ref 3.80–5.20)
RDW: 14.7 % — ABNORMAL HIGH (ref 11.5–14.5)
WBC: 15.1 10*3/uL — AB (ref 3.6–11.0)

## 2016-03-18 LAB — COMPREHENSIVE METABOLIC PANEL
ALBUMIN: 4.2 g/dL (ref 3.5–5.0)
ALT: 16 U/L (ref 14–54)
AST: 23 U/L (ref 15–41)
Alkaline Phosphatase: 76 U/L (ref 38–126)
Anion gap: 7 (ref 5–15)
BILIRUBIN TOTAL: 0.3 mg/dL (ref 0.3–1.2)
BUN: 10 mg/dL (ref 6–20)
CHLORIDE: 106 mmol/L (ref 101–111)
CO2: 24 mmol/L (ref 22–32)
Calcium: 9.2 mg/dL (ref 8.9–10.3)
Creatinine, Ser: 0.77 mg/dL (ref 0.44–1.00)
GFR calc Af Amer: >60 mL/min (ref >60)
GFR calc non Af Amer: >60 mL/min (ref >60)
GLUCOSE: 135 mg/dL — AB (ref 65–99)
POTASSIUM: 3.6 mmol/L (ref 3.5–5.1)
SODIUM: 137 mmol/L (ref 135–145)
Total Protein: 7.7 g/dL (ref 6.5–8.1)

## 2016-03-18 LAB — LIPASE, BLOOD: LIPASE: 12 U/L (ref 11–51)

## 2016-03-18 MED ORDER — MORPHINE SULFATE (PF) 4 MG/ML IV SOLN
8.0000 mg | Freq: Once | INTRAVENOUS | Status: AC
  Administered 2016-03-18: 8 mg via INTRAVENOUS
  Filled 2016-03-18: qty 2

## 2016-03-18 NOTE — ED Triage Notes (Signed)
EMS reports new onset RUQ abdominal pain starting at 1600 today. Pt presents w/ n/v x 15 + episodes since pain started. Pt denies urinary sxs at this time. Pt has hx of heroin use and now takes methadone. Zofran 4 mg IV administered by EMS en route. IVF NS 500 ml started by EMS.

## 2016-03-18 NOTE — ED Provider Notes (Signed)
[facility] Emergency Department Provider Note  ____________________________________________   First MD Initiated Contact with Patient 03/18/16 2320     (approximate)  I have reviewed the triage vital signs and the nursing notes.   HISTORY  Chief Complaint Abdominal Pain    HPI Megan Hopkins is a 31 y.o. female who presents by EMS for evaluation of acute onset severe right upper quadrant pain with intractable nausea and vomiting that started about 4 PM today, within about 30 minutes of eating a meal.  She states that the pain is sharp, constant, but does wax and wane in severity.  She states that it seems to come in waves.  It is nonradiating.  She denies fever/chills, chest pain, shortness of breath, dysuria, and lower abdominal pain.  She has never had any pain like this before.  She has not been ill recently and has had no respiratory symptoms.  She has never had a cholecystectomy nor appendectomy.  She has a history of heroin abuse but states that she has been clean for 8 years but does take 100 mg of methadone daily.   Past Medical History:  Diagnosis Date  . Anxiety   . Bronchitis    hx of  . Migraine    hx of  . Pneumonia     Patient Active Problem List   Diagnosis Date Noted  . Breast abscess 05/11/2014    Past Surgical History:  Procedure Laterality Date  . cervical colon biospy    . INCISION AND DRAINAGE ABSCESS Left 05/12/2014   Procedure: INCISION AND DRAINAGE ABSCESS;  Surgeon: Duwaine Maxin, MD;  Location: [facility] ORS;  Service: General;  Laterality: Left;  . KNEE ARTHROSCOPY     left  . LAPAROSCOPIC ENDOMETRIOSIS FULGURATION      Prior to Admission medications   Medication Sig Start Date End Date Taking? Authorizing Provider  cephALEXin (KEFLEX) 500 MG capsule Take 1 capsule (500 mg total) by mouth every 6 (six) hours. 05/15/14   Duwaine Maxin, MD  diphenhydrAMINE (BENADRYL) 25 MG tablet Take 25 mg by mouth at bedtime as  needed. For sleep.    Historical Provider, MD  ibuprofen (ADVIL,MOTRIN) 600 MG tablet Take 600 mg by mouth every 6 (six) hours as needed for moderate pain.    Historical Provider, MD  methadone (DOLOPHINE) 10 MG/ML solution Take 60 mg by mouth daily.    Historical Provider, MD  omeprazole (PRILOSEC OTC) 20 MG tablet Take 1 tablet (20 mg total) by mouth daily. 03/19/16 03/19/17  Loleta Rose, MD  ondansetron (ZOFRAN ODT) 4 MG disintegrating tablet Allow 1-2 tablets to dissolve in your mouth every 8 hours as needed for nausea/vomiting 03/19/16   Loleta Rose, MD  sertraline (ZOLOFT) 50 MG tablet Take 50 mg by mouth daily.    Historical Provider, MD  sucralfate (CARAFATE) 1 g tablet Take 1 tablet (1 g total) by mouth 4 (four) times daily as needed (for abdominal discomfort, nausea, and/or vomiting). 03/19/16   Loleta Rose, MD    Allergies Other; Bee venom; Doxycycline; Calamine; and Macrobid [nitrofurantoin monohyd macro]  History reviewed. No pertinent family history.  Social History Social History  Substance Use Topics  . Smoking status: Current Every Day Smoker    Packs/day: 0.25    Years: 8.00    Types: Cigarettes  . Smokeless tobacco: Never Used  . Alcohol use No     Comment: occassional    Review of Systems Constitutional: No fever/chills Eyes: No visual changes. ENT:  No sore throat. Cardiovascular: Denies chest pain. Respiratory: Denies shortness of breath. Gastrointestinal: See onset severe right upper quadrant pain with numerous episodes of vomiting and persistent nausea Genitourinary: Negative for dysuria. Musculoskeletal: Negative for back pain. Skin: Negative for rash. Neurological: Negative for headaches, focal weakness or numbness.  10-point ROS otherwise negative.  ____________________________________________   PHYSICAL EXAM:  VITAL SIGNS: ED Triage Vitals  Enc Vitals Group     BP 03/18/16 2303 132/74     Pulse Rate 03/18/16 2303 69     Resp 03/18/16 2303 14      Temp 03/18/16 2303 98.7 F (37.1 C)     Temp Source 03/18/16 2303 Oral     SpO2 03/18/16 2303 98 %     Weight 03/18/16 2305 200 lb (90.7 kg)     Height 03/18/16 2305 5\' 9"  (1.753 m)     Head Circumference --      Peak Flow --      Pain Score 03/18/16 2305 9     Pain Loc --      Pain Edu? --      Excl. in GC? --     Constitutional: Alert and oriented. Ill appearing but nontoxic.  Appears to be in pain Eyes: Conjunctivae are normal. PERRL. EOMI. Head: Atraumatic. Nose: No congestion/rhinnorhea. Mouth/Throat: Mucous membranes are dry. Neck: No stridor.  No meningeal signs.   Cardiovascular: Normal rate, regular rhythm. Good peripheral circulation. Grossly normal heart sounds. Respiratory: Normal respiratory effort.  No retractions. Lungs CTAB. Gastrointestinal: Soft but with severe tenderness to palpation of the right upper quadrant with positive Murphy sign.  No lower abdominal tenderness.  No rebound or guarding. Musculoskeletal: No lower extremity tenderness nor edema. No gross deformities of extremities. Neurologic:  Normal speech and language. No gross focal neurologic deficits are appreciated.  Skin:  Skin is warm, dry and intact. No rash noted. Psychiatric: Mood and affect are normal. Speech and behavior are normal.  ____________________________________________   LABS (all labs ordered are listed, but only abnormal results are displayed)  Labs Reviewed  COMPREHENSIVE METABOLIC PANEL - Abnormal; Notable for the following:       Result Value   Glucose, Bld 135 (*)    All other components within normal limits  CBC - Abnormal; Notable for the following:    WBC 15.1 (*)    RDW 14.7 (*)    All other components within normal limits  URINALYSIS, ROUTINE W REFLEX MICROSCOPIC - Abnormal; Notable for the following:    Color, Urine YELLOW (*)    APPearance HAZY (*)    Hgb urine dipstick LARGE (*)    Leukocytes, UA SMALL (*)    Squamous Epithelial / LPF 6-30 (*)    All  other components within normal limits  URINE DRUG SCREEN, QUALITATIVE ([facility] ONLY) - Abnormal; Notable for the following:    Opiate, Ur Screen POSITIVE (*)    Methadone Scn, Ur POSITIVE (*)    All other components within normal limits  LIPASE, BLOOD  ETHANOL  PREGNANCY, URINE   ____________________________________________  EKG  None - EKG not ordered by ED physician ____________________________________________  RADIOLOGY   Ct Abdomen Pelvis W Contrast  Result Date: 03/19/2016 CLINICAL DATA:  Right upper quadrant pain beginning at 16:00. Nausea and vomiting. EXAM: CT ABDOMEN AND PELVIS WITH CONTRAST TECHNIQUE: Multidetector CT imaging of the abdomen and pelvis was performed using the standard protocol following bolus administration of intravenous contrast. CONTRAST:  ISOVUE-300 IOPAMIDOL (ISOVUE-300) INJECTION 61% COMPARISON:  04/17/2014 FINDINGS: Lower chest: Mild atelectatic appearing posterior lung base opacities bilaterally. No pleural effusions. Hepatobiliary: No focal liver abnormality is seen. No gallstones, gallbladder wall thickening, or biliary dilatation. Pancreas: Unremarkable. No pancreatic ductal dilatation or surrounding inflammatory changes. Spleen: Normal in size without focal abnormality. Adrenals/Urinary Tract: Adrenal glands are unremarkable. Kidneys are normal, without renal calculi, focal lesion, or hydronephrosis. Bladder is unremarkable. Stomach/Bowel: Stomach is within normal limits. Appendix is normal. No evidence of bowel wall thickening, distention, or inflammatory changes. Vascular/Lymphatic: No significant vascular findings are present. No enlarged abdominal or pelvic lymph nodes. Reproductive: Uterus and bilateral adnexa are unremarkable. Other: Scant volume free pelvic fluid.  No inflammatory changes. Musculoskeletal: No significant skeletal lesions. Posterior decompression with instrumented fusion at L5-S1. IMPRESSION: No significant abnormality.  Scant  volume free pelvic fluid. Electronically Signed   By: Ellery Plunkaniel R Mitchell M.D.   On: 03/19/2016 03:13   [place] Abdomen Limited Ruq  Result Date: 03/19/2016 CLINICAL DATA:  Initial evaluation for acute severe right upper quadrant pain with nausea and vomiting. EXAM: US ABDOMEN LIMITED - RIGHT UPPER QUADRANT COMPARISON:  Prior CT from 04/17/2014. FINDINGS: Gallbladder: No gallstones or wall thickening visualized. No sonographic Murphy sign noted by sonographer. Common bile duct: Diameter: 5.2 mm Liver: Small hyper echoic well-circumscribed lesion measuring 6 x 8 x 7 mm noted within left hepatic lobe, likely a small hemangioma. This is felt to be of doubtful clinical significance. Within normal limits in parenchymal echogenicity. IMPRESSION: Negative right upper quadrant ultrasound. No evidence for cholelithiasis, acute cholecystitis, or biliary dilatation. Electronically Signed   By: Rise MuBenjamin  McClintock M.D.   On: 03/19/2016 00:56    ____________________________________________   PROCEDURES  Procedure(s) performed:   Procedures   Critical Care performed: No ____________________________________________   INITIAL IMPRESSION / ASSESSMENT AND PLAN / ED COURSE  Pertinent labs & imaging results that were available during my care of the patient were reviewed by me and considered in my medical decision making (see chart for details).  The patient appears to be in severe pain at this time and has a strongly positive Eulah PontMurphy sign on physical exam.  Given the acute onset of right upper quadrant pain, intractable vomiting, and the fact that it started within about 30 minutes of eating [place] suspect gallstones.  She has significant resistance to opioids due to a history of heroin abuse and now years on methadone so I will start with a dose of morphine 8 mg IV.  She received 4 mg of Zofran prior to arrival.  She is getting 1 L of fluids given the persistent vomiting.  Her vital signs are reassuring at this time  without even tachycardia but we will continue to monitor.   Clinical Course as of Mar 19 356  Caleen EssexFri Mar 18, 2016  2332 Moderate leukocytosis of 15 WBC: (!) 15.1 [CF]  Sat Mar 19, 2016  0100 Negative ultrasound.  Will further evaluate with CT scan.  Suspect gastritis but will rule out other acute/emergent causes with CT.  [CF]  0354 Patient is sleeping comfortably.  I woke her up with soft voice and she does state that she is feeling much better.  I explained to her that her ultrasound and her CT scan were normal and her labs are normal except for the slight white blood cell count.  I explained she may have a viral illness that is causing her acute gastritis and I told her I would prescribe my usual medications including Carafate, Zofran, and an over-the-counter PPI.  I encourage close outpatient follow-up.I gave my usual and customary return precautions.  She was able to tolerate by mouth intake in the emergency department (she did not vomit any of the two bottles of PO contrast).  [CF]    Clinical Course User Index [CF] Loleta Rose, MD    ____________________________________________  FINAL CLINICAL IMPRESSION(S) / ED DIAGNOSES  Final diagnoses:  Acute gastritis without hemorrhage, unspecified gastritis type  Non-intractable vomiting with nausea, unspecified vomiting type  RUQ pain     MEDICATIONS GIVEN DURING THIS VISIT:  Medications  morphine 4 MG/ML injection 8 mg (8 mg Intravenous Given 03/18/16 2354)  iopamidol (ISOVUE-300) 61 % injection 30 mL (30 mLs Oral Contrast Given 03/19/16 0115)  promethazine (PHENERGAN) injection 25 mg (25 mg Intravenous Given 03/19/16 0133)  morphine 4 MG/ML injection 8 mg (8 mg Intravenous Given 03/19/16 0229)  iopamidol (ISOVUE-300) 61 % injection 100 mL (100 mLs Intravenous Contrast Given 03/19/16 0259)     NEW OUTPATIENT MEDICATIONS STARTED DURING THIS VISIT:  New Prescriptions   OMEPRAZOLE (PRILOSEC OTC) 20 MG TABLET    Take 1 tablet (20 mg total)  by mouth daily.   ONDANSETRON (ZOFRAN ODT) 4 MG DISINTEGRATING TABLET    Allow 1-2 tablets to dissolve in your mouth every 8 hours as needed for nausea/vomiting   SUCRALFATE (CARAFATE) 1 G TABLET    Take 1 tablet (1 g total) by mouth 4 (four) times daily as needed (for abdominal discomfort, nausea, and/or vomiting).    Modified Medications   No medications on file    Discontinued Medications   No medications on file     Note:  This document was prepared using Dragon voice recognition software and may include unintentional dictation errors.    Loleta Rose, MD 03/19/16 [phone]

## 2016-03-19 ENCOUNTER — Emergency Department: Payer: Medicaid Other

## 2016-03-19 LAB — URINALYSIS, ROUTINE W REFLEX MICROSCOPIC
Bacteria, UA: NONE SEEN
Bilirubin Urine: NEGATIVE
GLUCOSE, UA: NEGATIVE mg/dL
Ketones, ur: NEGATIVE mg/dL
Nitrite: NEGATIVE
PROTEIN: NEGATIVE mg/dL
Specific Gravity, Urine: 1.023 (ref 1.005–1.030)
pH: 5 (ref 5.0–8.0)

## 2016-03-19 LAB — URINE DRUG SCREEN, QUALITATIVE (ARMC ONLY)
AMPHETAMINES, UR SCREEN: NOT DETECTED
Barbiturates, Ur Screen: NOT DETECTED
Benzodiazepine, Ur Scrn: NOT DETECTED
Cannabinoid 50 Ng, Ur ~~LOC~~: NOT DETECTED
Cocaine Metabolite,Ur ~~LOC~~: NOT DETECTED
MDMA (Ecstasy)Ur Screen: NOT DETECTED
Methadone Scn, Ur: POSITIVE — AB
Opiate, Ur Screen: POSITIVE — AB
Phencyclidine (PCP) Ur S: NOT DETECTED
Tricyclic, Ur Screen: NOT DETECTED

## 2016-03-19 LAB — PREGNANCY, URINE: Preg Test, Ur: NEGATIVE

## 2016-03-19 LAB — ETHANOL: Alcohol, Ethyl (B): <5 mg/dL (ref <5)

## 2016-03-19 MED ORDER — IOPAMIDOL (ISOVUE-300) INJECTION 61%
30.0000 mL | Freq: Once | INTRAVENOUS | Status: AC
  Administered 2016-03-19: 30 mL via ORAL

## 2016-03-19 MED ORDER — SUCRALFATE 1 G PO TABS: 1.0000 g | ORAL_TABLET | Freq: Four times a day (QID) | ORAL | 1 refills | Status: DC | PRN

## 2016-03-19 MED ORDER — MORPHINE SULFATE (PF) 4 MG/ML IV SOLN
8.0000 mg | Freq: Once | INTRAVENOUS | Status: AC
  Administered 2016-03-19: 8 mg via INTRAVENOUS
  Filled 2016-03-19: qty 2

## 2016-03-19 MED ORDER — ONDANSETRON 4 MG PO TBDP: ORAL_TABLET | ORAL | 0 refills | Status: DC

## 2016-03-19 MED ORDER — IOPAMIDOL (ISOVUE-300) INJECTION 61%
100.0000 mL | Freq: Once | INTRAVENOUS | Status: AC | PRN
  Administered 2016-03-19: 100 mL via INTRAVENOUS

## 2016-03-19 MED ORDER — PROMETHAZINE HCL 25 MG/ML IJ SOLN
25.0000 mg | Freq: Once | INTRAMUSCULAR | Status: AC
  Administered 2016-03-19: 25 mg via INTRAVENOUS
  Filled 2016-03-19: qty 1

## 2016-03-19 MED ORDER — OMEPRAZOLE MAGNESIUM 20 MG PO TBEC: 20.0000 mg | DELAYED_RELEASE_TABLET | Freq: Every day | ORAL | 1 refills | Status: DC

## 2016-03-19 NOTE — Discharge Instructions (Signed)

## 2017-03-10 IMAGING — US US ABDOMEN LIMITED
1 series · 14 of 25 positions shown · non-contrast
Comparison: Prior CT from 04/17/2014.

CLINICAL DATA: Initial evaluation for acute severe right upper
quadrant pain with nausea and vomiting.

EXAM:
US ABDOMEN LIMITED - RIGHT UPPER QUADRANT

[Series 1: us abdomen limited · 0.23mm/px · 14 of 46 slices shown]
[im 1/46]
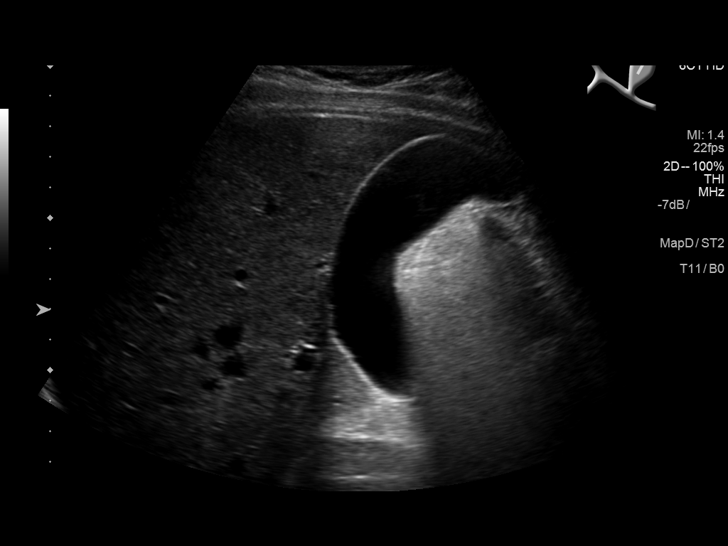
[im 4/46]
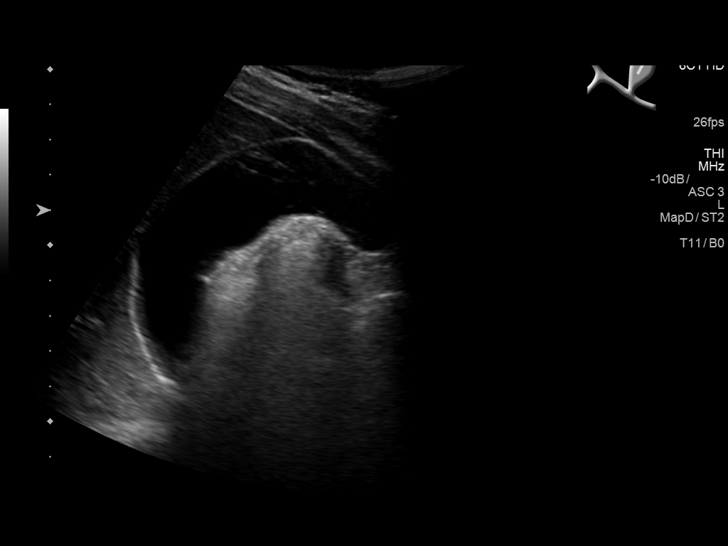
[im 8/46]
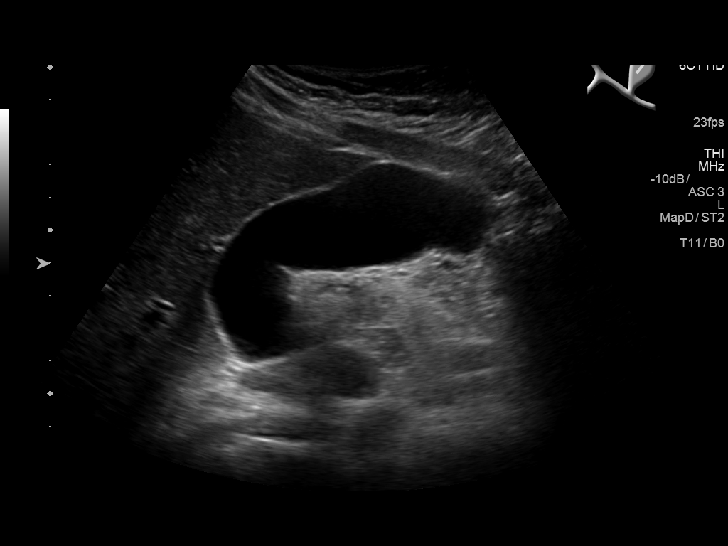
[im 12/46]
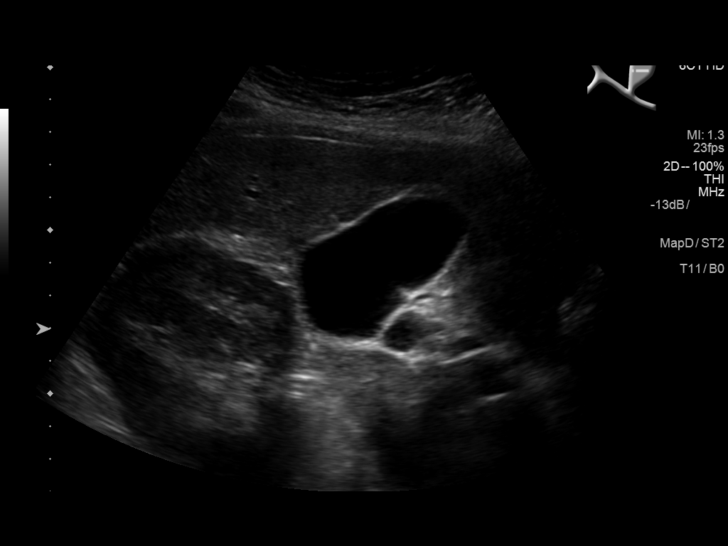
[im 16/46]
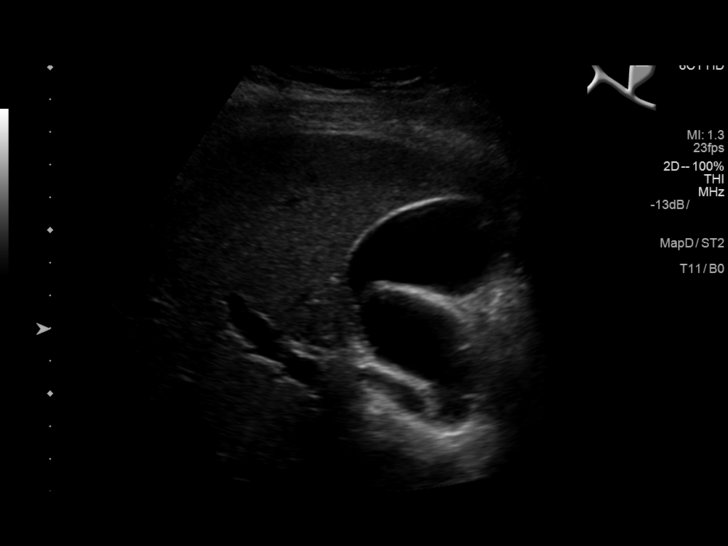
[im 17/46]
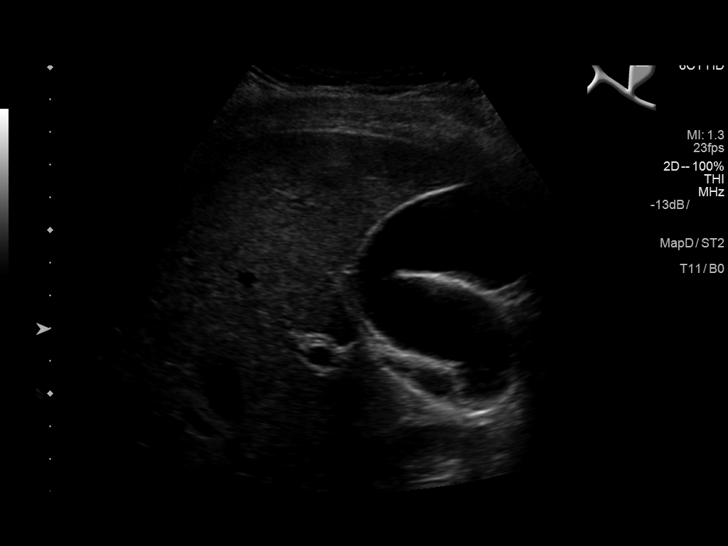
[im 21/46]
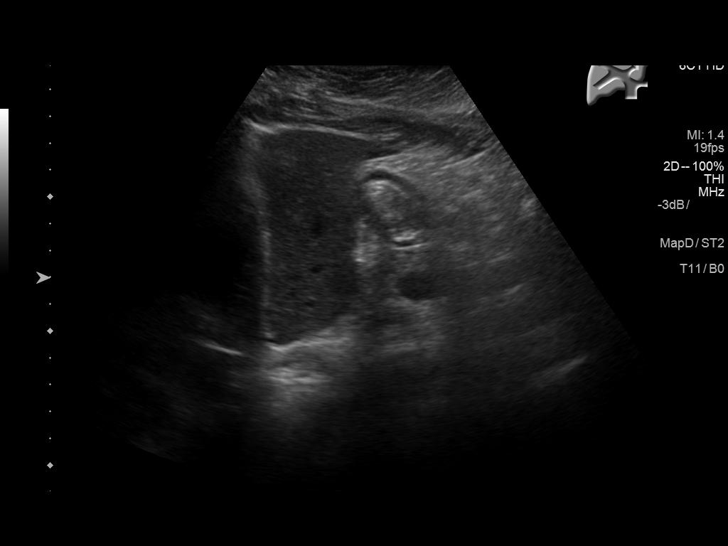
[im 25/46]
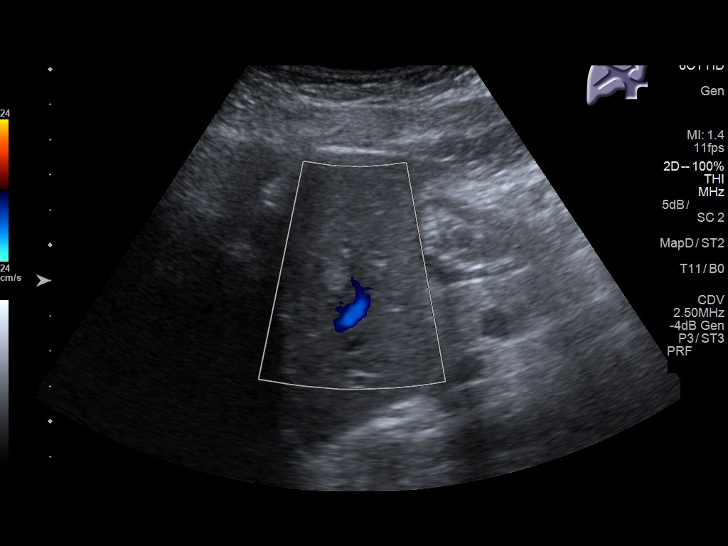
[im 29/46]
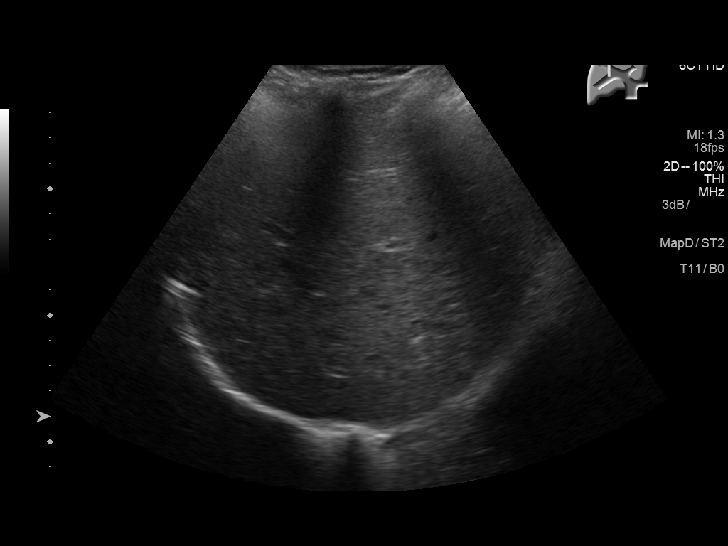
[im 31/46]
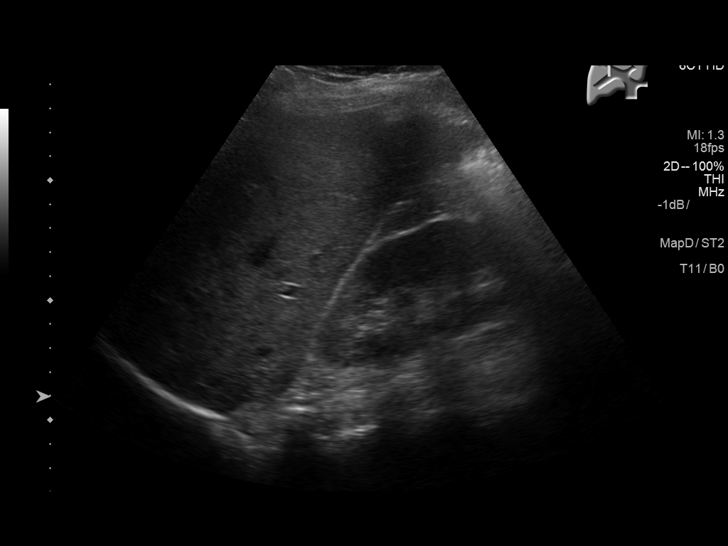
[im 34/46]
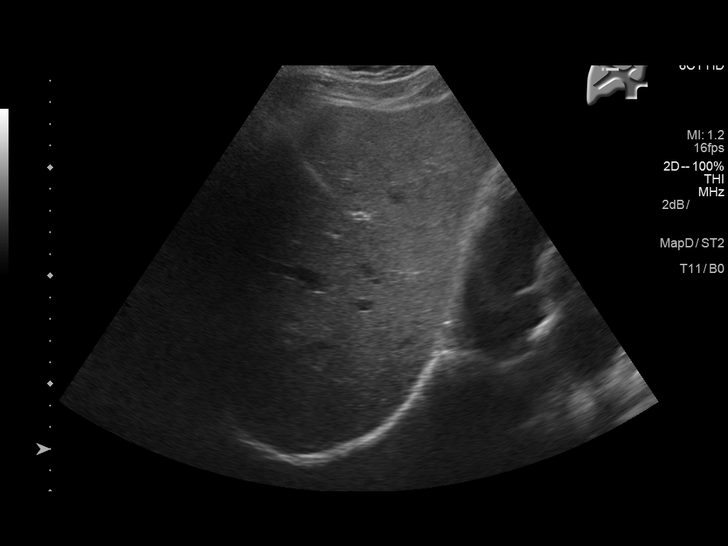
[im 38/46]
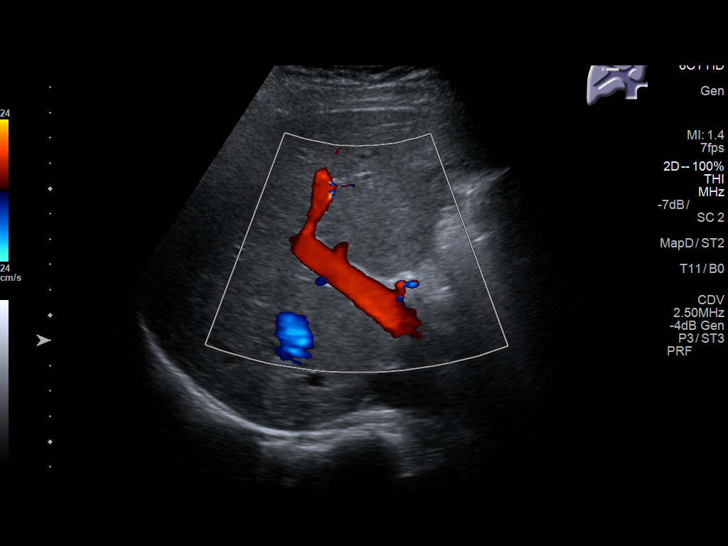
[im 42/46]
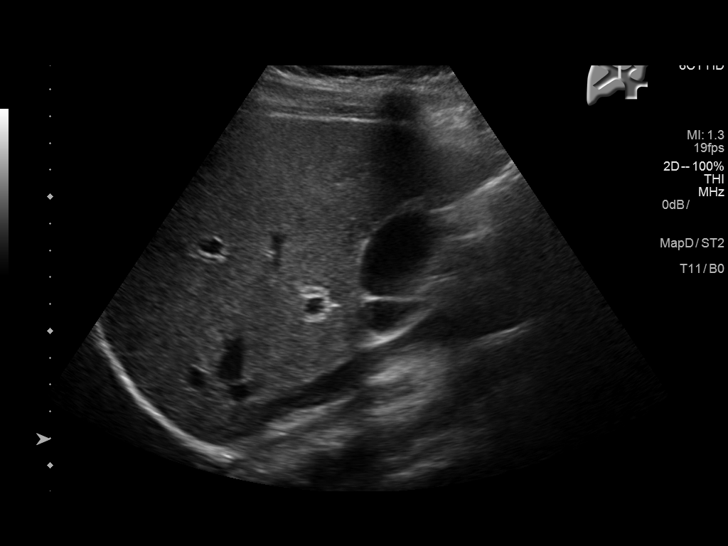
[im 46/46]
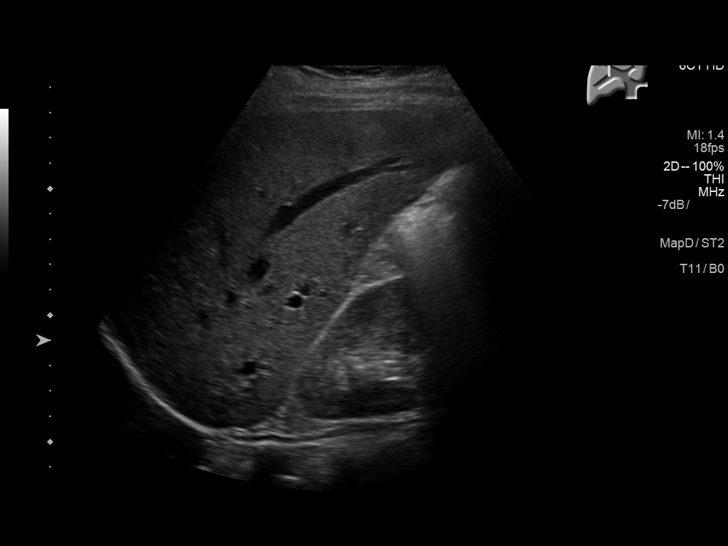

[14 of 25 positions shown; findings below may reference images not displayed]

FINDINGS: Gallbladder:

No gallstones or wall thickening visualized. No sonographic Murphy
sign noted by sonographer.

Common bile duct:

Diameter: 5.2 mm

Liver:

Small hyper echoic well-circumscribed lesion measuring 6 x 8 x 7 mm
noted within left hepatic lobe, likely a small hemangioma. This is
felt to be of doubtful clinical significance. Within normal limits
in parenchymal echogenicity.
IMPRESSION: Negative right upper quadrant ultrasound. No evidence for
cholelithiasis, acute cholecystitis, or biliary dilatation.

## 2017-11-11 IMAGING — CT CT ABD-PELV W/ CM
2 of 4 series · 16 of 46 positions shown, 18 images · IV contrast (APPLIED)
Comparison: 04/17/2014

CLINICAL DATA: Right upper quadrant pain beginning at [DATE]. Nausea
and vomiting.

EXAM:
CT ABDOMEN AND PELVIS WITH CONTRAST
TECHNIQUE: Multidetector CT imaging of the abdomen and pelvis was performed
using the standard protocol following bolus administration of
intravenous contrast.
CONTRAST:  100mL [id number] IOPAMIDOL ([id number]) INJECTION 61%

[Series 2: axial st · axial · 0.96mm/px · z∈[-874,-374]mm · 13 of 110 slices shown, 15 images]
[im 5/110  soft-tissue]
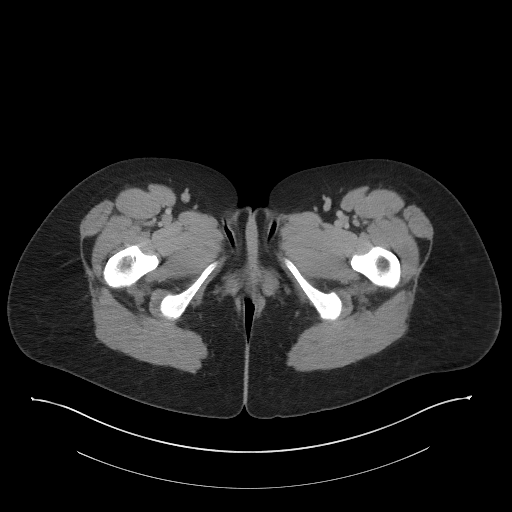
[im 5/110  bone]
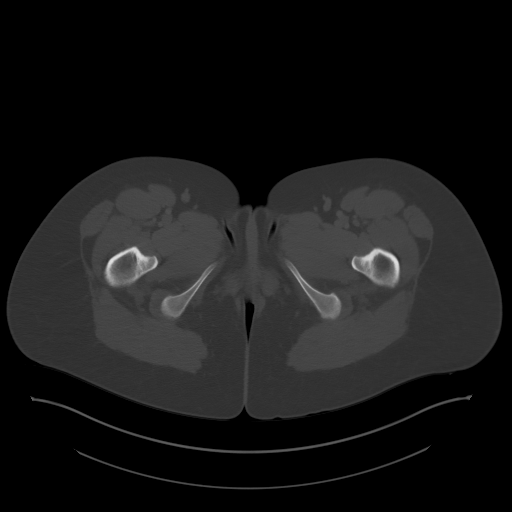
[im 14/110  soft-tissue]
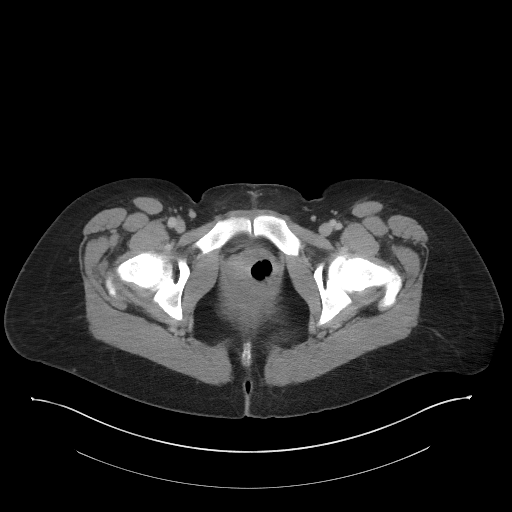
[im 22/110  soft-tissue]
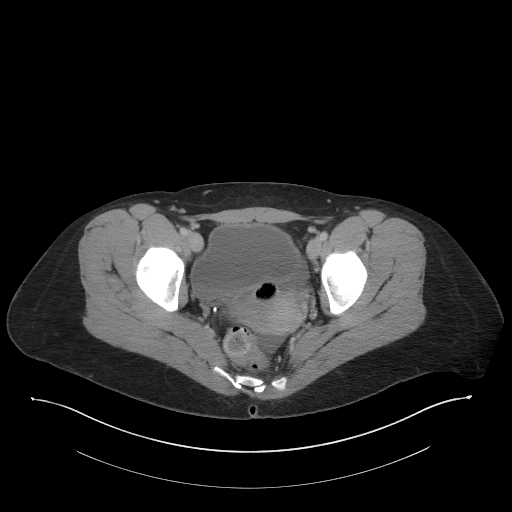
[im 31/110  soft-tissue]
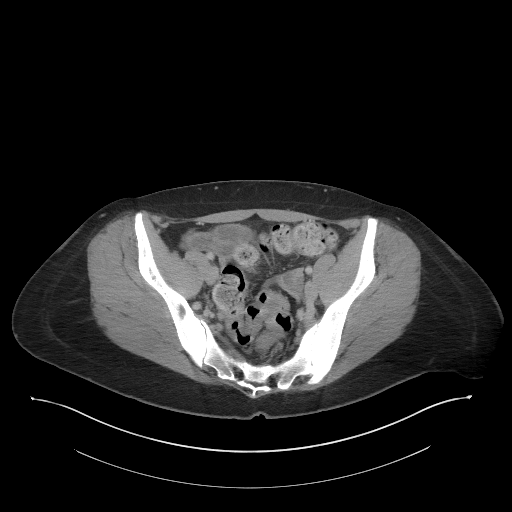
[im 40/110  soft-tissue]
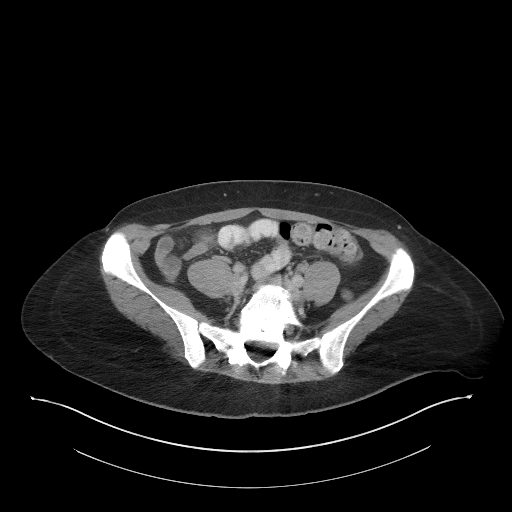
[im 48/110  soft-tissue]
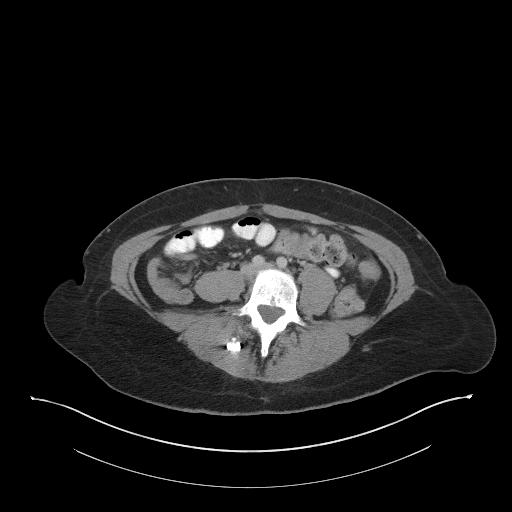
[im 57/110  soft-tissue]
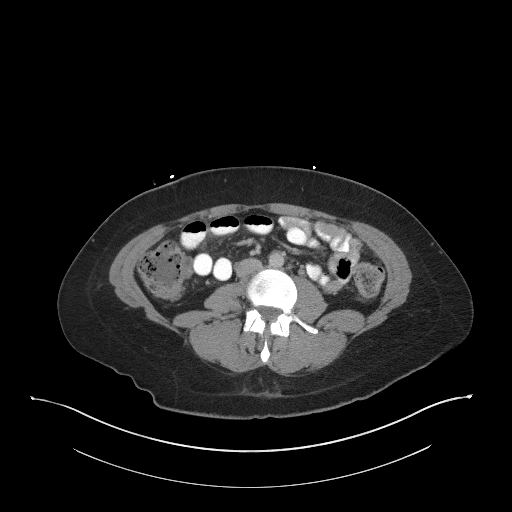
[im 62/110  soft-tissue]
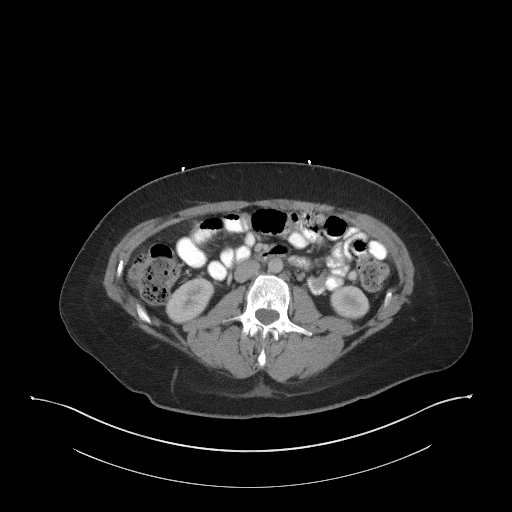
[im 70/110  soft-tissue]
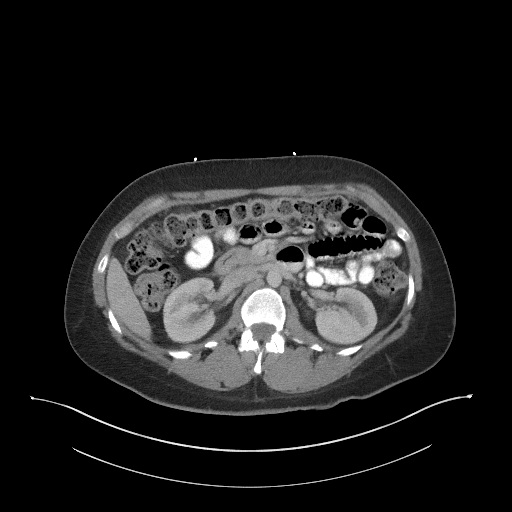
[im 70/110  bone]
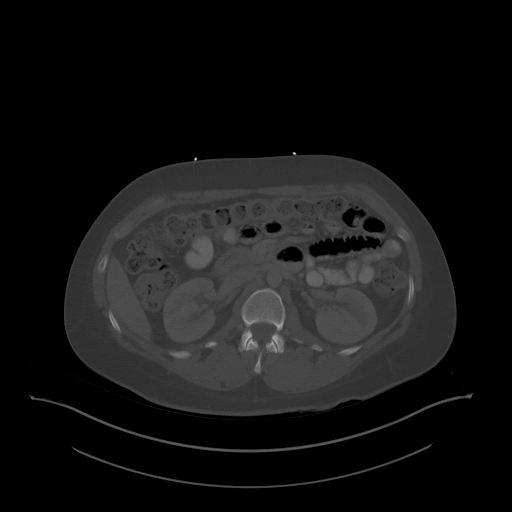
[im 79/110  soft-tissue]
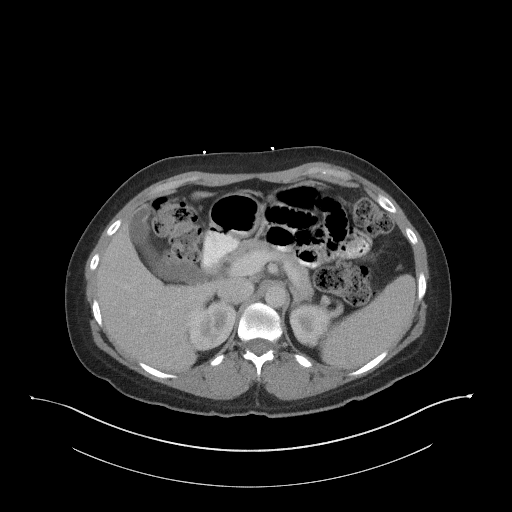
[im 88/110  soft-tissue]
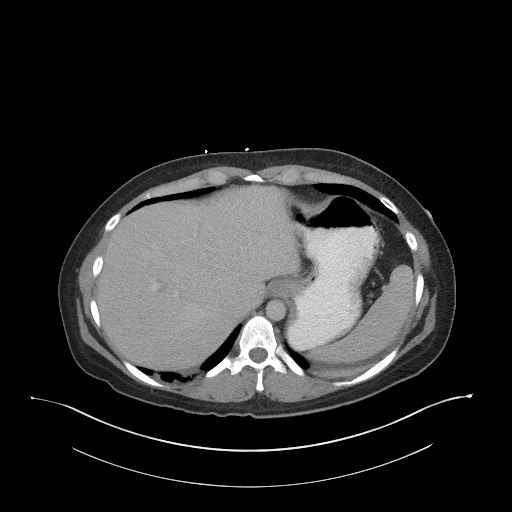
[im 96/110  soft-tissue]
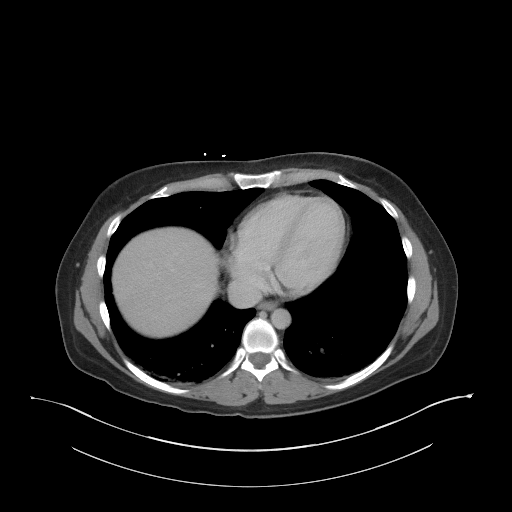
[im 105/110  soft-tissue]
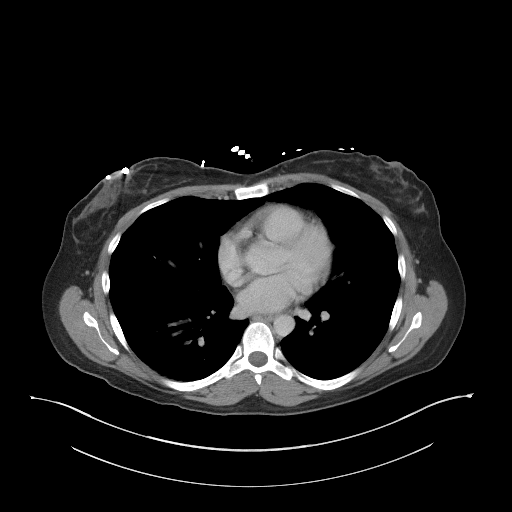

[Series 5: coronal st · coronal · 0.74mm/px · 3 of 82 slices shown]
[im 28/82  soft-tissue]
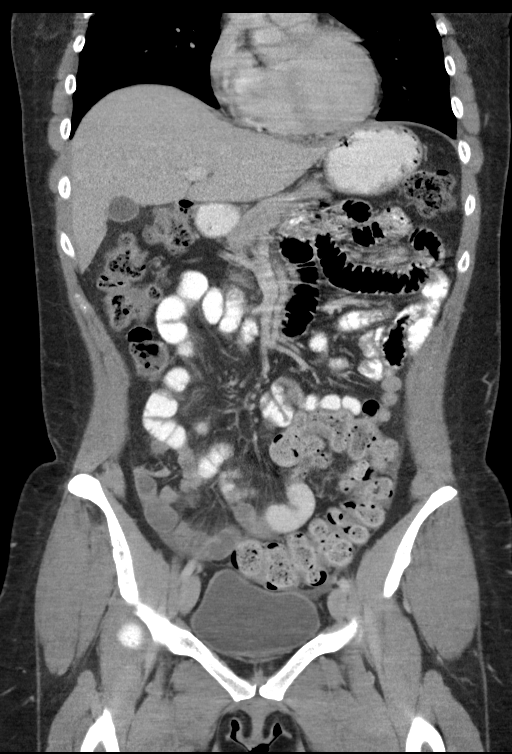
[im 37/82  soft-tissue]
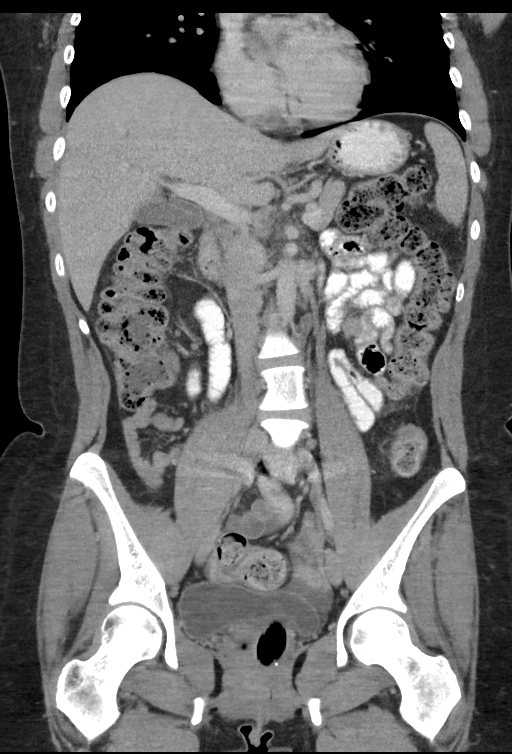
[im 46/82  soft-tissue]
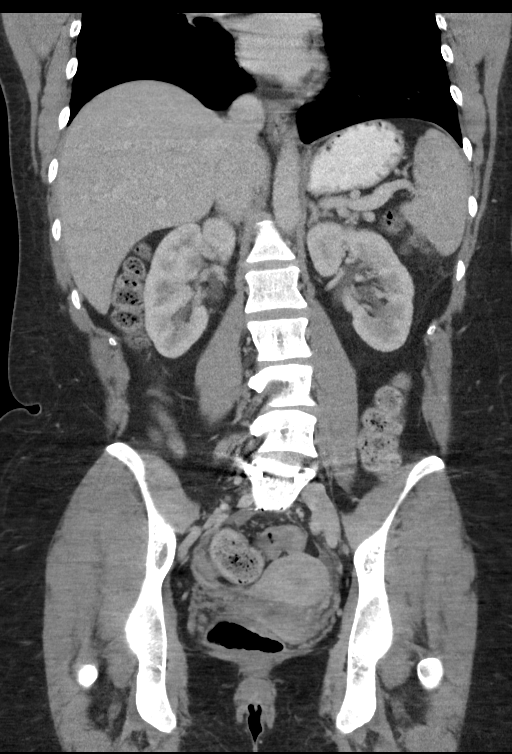

[16 of 46 positions shown; findings below may reference images not displayed]

FINDINGS: Lower chest: Mild atelectatic appearing posterior lung base
opacities bilaterally. No pleural effusions.

Hepatobiliary: No focal liver abnormality is seen. No gallstones,
gallbladder wall thickening, or biliary dilatation.

Pancreas: Unremarkable. No pancreatic ductal dilatation or
surrounding inflammatory changes.

Spleen: Normal in size without focal abnormality.

Adrenals/Urinary Tract: Adrenal glands are unremarkable. Kidneys are
normal, without renal calculi, focal lesion, or hydronephrosis.
Bladder is unremarkable.

Stomach/Bowel: Stomach is within normal limits. Appendix is normal.
No evidence of bowel wall thickening, distention, or inflammatory
changes.

Vascular/Lymphatic: No significant vascular findings are present. No
enlarged abdominal or pelvic lymph nodes.

Reproductive: Uterus and bilateral adnexa are unremarkable.

Other: Scant volume free pelvic fluid.  No inflammatory changes.

Musculoskeletal: No significant skeletal lesions. Posterior
decompression with instrumented fusion at L5-S1.
IMPRESSION: No significant abnormality.  Scant volume free pelvic fluid.

## 2019-05-03 ENCOUNTER — Emergency Department
Admission: EM | Admit: 2019-05-03 | Discharge: 2019-05-03 | Disposition: A | Discharge status: Home / Self Care | Payer: Managed Care, Other (non HMO) | Attending: Emergency Medicine | Admitting: Emergency Medicine

## 2019-05-03 ENCOUNTER — Encounter: Payer: Self-pay | Admitting: Emergency Medicine

## 2019-05-03 ENCOUNTER — Emergency Department: Payer: Managed Care, Other (non HMO)

## 2019-05-03 ENCOUNTER — Other Ambulatory Visit: Payer: Self-pay

## 2019-05-03 DIAGNOSIS — Z3491 Encounter for supervision of normal pregnancy, unspecified, first trimester: Secondary | ICD-10-CM

## 2019-05-03 DIAGNOSIS — Z3A01 Less than 8 weeks gestation of pregnancy: Secondary | ICD-10-CM | POA: Insufficient documentation

## 2019-05-03 DIAGNOSIS — Z79899 Other long term (current) drug therapy: Secondary | ICD-10-CM | POA: Diagnosis not present

## 2019-05-03 DIAGNOSIS — R102 Pelvic and perineal pain: Secondary | ICD-10-CM | POA: Diagnosis not present

## 2019-05-03 DIAGNOSIS — O26891 Other specified pregnancy related conditions, first trimester: Secondary | ICD-10-CM | POA: Diagnosis not present

## 2019-05-03 DIAGNOSIS — F17210 Nicotine dependence, cigarettes, uncomplicated: Secondary | ICD-10-CM | POA: Diagnosis not present

## 2019-05-03 DIAGNOSIS — Z3490 Encounter for supervision of normal pregnancy, unspecified, unspecified trimester: Secondary | ICD-10-CM

## 2019-05-03 DIAGNOSIS — O009 Unspecified ectopic pregnancy without intrauterine pregnancy: Secondary | ICD-10-CM

## 2019-05-03 LAB — CBC
HCT: 41.3 % (ref 36.0–46.0)
Hemoglobin: 13.7 g/dL (ref 12.0–15.0)
MCH: 29.5 pg (ref 26.0–34.0)
MCHC: 33.2 g/dL (ref 30.0–36.0)
MCV: 88.8 fL (ref 80.0–100.0)
Platelets: 245 10*3/uL (ref 150–400)
RBC: 4.65 MIL/uL (ref 3.87–5.11)
RDW: 13.6 % (ref 11.5–15.5)
WBC: 7 10*3/uL (ref 4.0–10.5)
nRBC: 0 % (ref 0.0–0.2)

## 2019-05-03 LAB — HCG, QUANTITATIVE, PREGNANCY: hCG, Beta Chain, Quant, S: 3722 m[IU]/mL — ABNORMAL HIGH (ref <5)

## 2019-05-03 LAB — POCT PREGNANCY, URINE: Preg Test, Ur: POSITIVE — AB

## 2019-05-03 LAB — COMPREHENSIVE METABOLIC PANEL
ALT: 20 U/L (ref 0–44)
AST: 17 U/L (ref 15–41)
Albumin: 4 g/dL (ref 3.5–5.0)
Alkaline Phosphatase: 62 U/L (ref 38–126)
Anion gap: 8 (ref 5–15)
BUN: 13 mg/dL (ref 6–20)
CO2: 23 mmol/L (ref 22–32)
Calcium: 8.9 mg/dL (ref 8.9–10.3)
Chloride: 107 mmol/L (ref 98–111)
Creatinine, Ser: 1.01 mg/dL — ABNORMAL HIGH (ref 0.44–1.00)
GFR calc Af Amer: >60 mL/min (ref >60)
GFR calc non Af Amer: >60 mL/min (ref >60)
Glucose, Bld: 99 mg/dL (ref 70–99)
Potassium: 4.1 mmol/L (ref 3.5–5.1)
Sodium: 138 mmol/L (ref 135–145)
Total Bilirubin: 0.4 mg/dL (ref 0.3–1.2)
Total Protein: 7.6 g/dL (ref 6.5–8.1)

## 2019-05-03 LAB — URINALYSIS, COMPLETE (UACMP) WITH MICROSCOPIC
Bacteria, UA: NONE SEEN
Bilirubin Urine: NEGATIVE
Glucose, UA: NEGATIVE mg/dL
Hgb urine dipstick: NEGATIVE
Ketones, ur: NEGATIVE mg/dL
Nitrite: NEGATIVE
Protein, ur: NEGATIVE mg/dL
Specific Gravity, Urine: 1.023 (ref 1.005–1.030)
pH: 5 (ref 5.0–8.0)

## 2019-05-03 NOTE — Discharge Instructions (Signed)
Call your OB to set up a follow-up in 48 to 72 hours for repeat lab work.  I would recommend starting a prenatal vitamin while you decide what to do next.  Return to the ER if you have worsening pain, bleeding, shortness of breath, or other concerning symptoms.

## 2019-05-03 NOTE — ED Triage Notes (Signed)
Pt here for left pelvic pain. [facility] pt r/o ectopic pregnancy.  They estimate pt 3-[redacted] weeks pregnant but could not see embryo on [place].  Sent to ED for further work up. Pt does have hx of ovarian cyst. No fever. + urine preg. No vaginal bleeding. Left pelvic pain has been intermittent x 3 days.

## 2019-05-03 NOTE — ED Provider Notes (Addendum)
[facility] Emergency Department Provider Note  ____________________________________________   First MD Initiated Contact with Patient 05/03/19 1354     (approximate)  I have reviewed the triage vital signs and the nursing notes.   HISTORY  Chief Complaint Abdominal Pain    HPI Megan Hopkins is a 34 y.o. female an estimated [redacted] weeks gestational age here with abdominal pain and pregnancy.  The patient states that her symptoms started approximately a week and half ago.  She has been having intermittent lower suprapubic and left lower quadrant pain.  She took a home pregnancy test and noted that it was positive.  She had been told that she could not get pregnant after her last delivery, due to her endometriosis.  She states that she went to outpatient OB today, and they were not able to locate the pregnancy so they sent her here for further evaluation.  She denies known history of ectopics.  No known history of PID.  She denies any chest pain or shortness of breath.  Other complaints.  No specific alleviating or aggravating factors.        Past Medical History:  Diagnosis Date  . Anxiety   . Bronchitis    hx of  . Migraine    hx of  . Pneumonia     Patient Active Problem List   Diagnosis Date Noted  . Breast abscess 05/11/2014    Past Surgical History:  Procedure Laterality Date  . cervical colon biospy    . INCISION AND DRAINAGE ABSCESS Left 05/12/2014   Procedure: INCISION AND DRAINAGE ABSCESS;  Surgeon: Duwaine Maxin, MD;  Location: [facility] ORS;  Service: General;  Laterality: Left;  . KNEE ARTHROSCOPY     left  . LAPAROSCOPIC ENDOMETRIOSIS FULGURATION      Prior to Admission medications   Medication Sig Start Date End Date Taking? Authorizing Provider  cephALEXin (KEFLEX) 500 MG capsule Take 1 capsule (500 mg total) by mouth every 6 (six) hours. 05/15/14   Duwaine Maxin, MD  diphenhydrAMINE (BENADRYL) 25 MG tablet Take 25 mg by mouth at  bedtime as needed. For sleep.    [provider]  ibuprofen (ADVIL,MOTRIN) 600 MG tablet Take 600 mg by mouth every 6 (six) hours as needed for moderate pain.    [provider]  methadone (DOLOPHINE) 10 MG/ML solution Take 60 mg by mouth daily.    [provider]  omeprazole (PRILOSEC OTC) 20 MG tablet Take 1 tablet (20 mg total) by mouth daily. 03/19/16 03/19/17  Loleta Rose, MD  ondansetron (ZOFRAN ODT) 4 MG disintegrating tablet Allow 1-2 tablets to dissolve in your mouth every 8 hours as needed for nausea/vomiting 03/19/16   Loleta Rose, MD  sertraline (ZOLOFT) 50 MG tablet Take 50 mg by mouth daily.    [provider]  sucralfate (CARAFATE) 1 g tablet Take 1 tablet (1 g total) by mouth 4 (four) times daily as needed (for abdominal discomfort, nausea, and/or vomiting). 03/19/16   Loleta Rose, MD    Allergies Other, Bee venom, Doxycycline, Calamine, and Macrobid [nitrofurantoin monohyd macro]  History reviewed. No pertinent family history.  Social History Social History   Tobacco Use  . Smoking status: Current Every Day Smoker    Packs/day: 0.25    Years: 8.00    Pack years: 2.00    Types: Cigarettes  . Smokeless tobacco: Never Used  Substance Use Topics  . Alcohol use: No    Comment: occassional  . Drug use:  No    Review of Systems  Review of Systems  Constitutional: Negative for fever.  HENT: Negative for congestion and sore throat.   Eyes: Negative for visual disturbance.  Respiratory: Negative for cough and shortness of breath.   Cardiovascular: Negative for chest pain.  Gastrointestinal: Positive for abdominal pain. Negative for diarrhea, nausea and vomiting.  Genitourinary: Positive for pelvic pain. Negative for flank pain.  Musculoskeletal: Negative for back pain and neck pain.  Skin: Negative for rash and wound.  Neurological: Negative for weakness.  All other systems reviewed and are negative.     ____________________________________________  PHYSICAL EXAM:      VITAL SIGNS: ED Triage Vitals  Enc Vitals Group     BP 05/03/19 1137 130/71     Pulse Rate 05/03/19 1137 (!) 105     Resp 05/03/19 1137 18     Temp 05/03/19 1137 98.8 F (37.1 C)     Temp Source 05/03/19 1137 Oral     SpO2 05/03/19 1137 100 %     Weight 05/03/19 1137 218 lb (98.9 kg)     Height 05/03/19 1137 5\' 8"  (1.727 m)     Head Circumference --      Peak Flow --      Pain Score 05/03/19 1142 0     Pain Loc --      Pain Edu? --      Excl. in GC? --      Physical Exam Vitals and nursing note reviewed.  Constitutional:      General: She is not in acute distress.    Appearance: She is well-developed.  HENT:     Head: Normocephalic and atraumatic.  Eyes:     Conjunctiva/sclera: Conjunctivae normal.  Cardiovascular:     Rate and Rhythm: Normal rate and regular rhythm.     Heart sounds: Normal heart sounds.  Pulmonary:     Effort: Pulmonary effort is normal. No respiratory distress.     Breath sounds: No wheezing.  Abdominal:     General: There is no distension.     Tenderness: There is no abdominal tenderness. There is no guarding or rebound.  Musculoskeletal:     Cervical back: Neck supple.  Skin:    General: Skin is warm.     Capillary Refill: Capillary refill takes less than 2 seconds.     Findings: No rash.  Neurological:     Mental Status: She is alert and oriented to person, place, and time.     Motor: No abnormal muscle tone.       ____________________________________________   LABS (all labs ordered are listed, but only abnormal results are displayed)  Labs Reviewed  COMPREHENSIVE METABOLIC PANEL - Abnormal; Notable for the following components:      Result Value   Creatinine, Ser 1.01 (*)    All other components within normal limits  URINALYSIS, COMPLETE (UACMP) WITH MICROSCOPIC - Abnormal; Notable for the following components:   Color, Urine YELLOW (*)    APPearance CLEAR  (*)    Leukocytes,Ua TRACE (*)    All other components within normal limits  HCG, QUANTITATIVE, PREGNANCY - Abnormal; Notable for the following components:   hCG, Beta Chain, Quant, S 3,722 (*)    All other components within normal limits  POCT PREGNANCY, URINE - Abnormal; Notable for the following components:   Preg Test, Ur POSITIVE (*)    All other components within normal limits  CBC  POC URINE PREG, ED  ____________________________________________  EKG:  ________________________________________  RADIOLOGY All imaging, including plain films, CT scans, and ultrasounds, independently reviewed by me, and interpretations confirmed via formal radiology reads.  ED MD interpretation:   Ultrasound: Intrauterine pregnancy with visualized gestational sac and likely early fetal pole, no sign of ectopic Official radiology report(s): US OB LESS THAN 14 WEEKS WITH OB TRANSVAGINAL  Result Date: 05/03/2019 CLINICAL DATA:  Abdominopelvic pain, left lower quadrant pain for 3 days, quantitative beta hCG 3722 EXAM: OBSTETRIC <14 WK [place] AND TRANSVAGINAL OB US TECHNIQUE: Both transabdominal and transvaginal ultrasound examinations were performed for complete evaluation of the gestation as well as the maternal uterus, adnexal regions, and pelvic cul-de-sac. Transvaginal technique was performed to assess early pregnancy. COMPARISON:  None. FINDINGS: LMP: 03/30/2019 GA by LMP: 4 w  6 d EDC by LMP: 01/04/2020 Intrauterine gestational sac: Single Yolk sac: Possible early yolk sac seen in the inferior pole of the gestational sac. Embryo:  Not Visualized. Cardiac Activity: Not Visualized. MSD: 4.8 mm   5 w   2 d Subchorionic hemorrhage:  None visualized. Maternal uterus/adnexae: Normal appearance of the maternal uterus and ovaries. IMPRESSION: Probable early intrauterine gestational sac, with a structure likely reflecting an early yolk sac. No fetal pole, or cardiac activity yet visualized. Recommend follow-up  quantitative B-HCG levels and follow-up US in 14 days to assess viability. This recommendation follows SRU consensus guidelines: Diagnostic Criteria for Nonviable Pregnancy Early in the First Trimester. Malva Limes Med 2013; 616:8372-90. Electronically Signed   By: Kreg Shropshire M.D.   On: 05/03/2019 15:08    ____________________________________________  PROCEDURES   Procedure(s) performed (including Critical Care):  Procedures  ____________________________________________  INITIAL IMPRESSION / MDM / ASSESSMENT AND PLAN / ED COURSE  As part of my medical decision making, I reviewed the following data within the electronic MEDICAL RECORD NUMBER Nursing notes reviewed and incorporated, Old chart reviewed, Notes from prior ED visits, and [place] Controlled Substance Database       *Megan Hopkins was evaluated in Emergency Department on 05/03/2019 for the symptoms described in the history of present illness. She was evaluated in the context of the global COVID-19 pandemic, which necessitated consideration that the patient might be at risk for infection with the SARS-CoV-2 virus that causes COVID-19. Institutional protocols and algorithms that pertain to the evaluation of patients at risk for COVID-19 are in a state of rapid change based on information released by regulatory bodies including the CDC and federal and state organizations. These policies and algorithms were followed during the patient's care in the ED.  Some ED evaluations and interventions may be delayed as a result of limited staffing during the pandemic.*     Medical Decision Making: 34 year old G3, P1 here with lower abdominal pain in early pregnancy.  No vaginal bleeding.  She is hemodynamically stable.  Screening lab work is unremarkable.  She is not anemic.  Ultrasound shows likely early intrauterine gestational sac with early yolk sac.  No fetal activity noted.  Per radiology recommendations, will follow up with quantitative beta-hCG in  clinic likely on Monday, and ultrasound in 14 days.  Of note, patient is interested and discussing medical termination, and I have referred her to clinic.  Otherwise, I see no evidence of ectopic.  She is hemodynamically stable.  Discharged home.  ____________________________________________  FINAL CLINICAL IMPRESSION(S) / ED DIAGNOSES  First trimester pregnancy   MEDICATIONS GIVEN DURING THIS VISIT:  Medications - No data to display   ED Discharge Orders  None       Note:  This document was prepared using Dragon voice recognition software and may include unintentional dictation errors.   Duffy Bruce, MD 05/03/19 [address]    Duffy Bruce, MD 05/03/19 2050

## 2020-01-11 HISTORY — PX: DILATION AND CURETTAGE OF UTERUS: SHX78

## 2020-12-25 IMAGING — US US OB < 14 WEEKS - US OB TV
1 series · 13 of 25 positions shown · non-contrast
Comparison: None.

CLINICAL DATA: Abdominopelvic pain, left lower quadrant pain for 3
days, quantitative beta hCG 0044

EXAM:
OBSTETRIC <14 WK US AND TRANSVAGINAL OB US
TECHNIQUE: Both transabdominal and transvaginal ultrasound examinations were
performed for complete evaluation of the gestation as well as the
maternal uterus, adnexal regions, and pelvic cul-de-sac.
Transvaginal technique was performed to assess early pregnancy.

[Series 1: us ob less than 14 weeks with ob transvaginal · 13 of 25 slices shown]
[im 1/25]
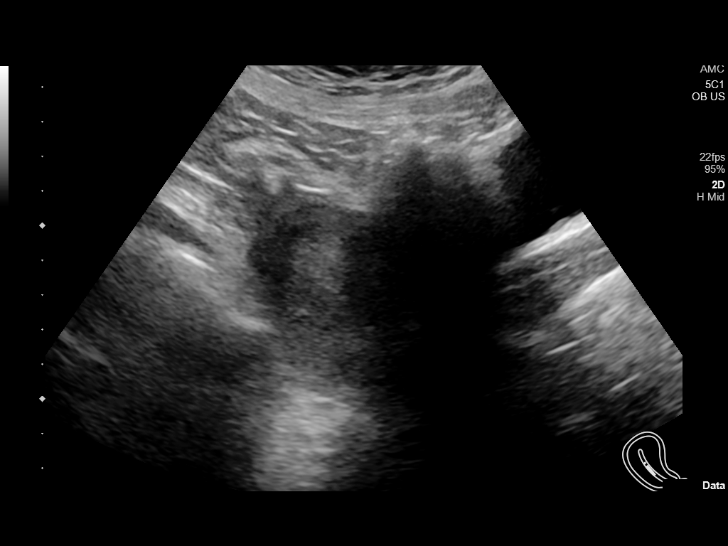
[im 3/25]
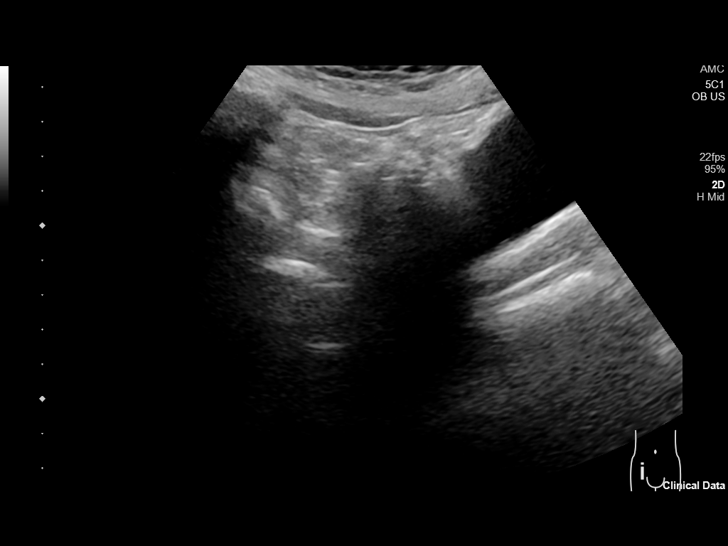
[im 5/25]
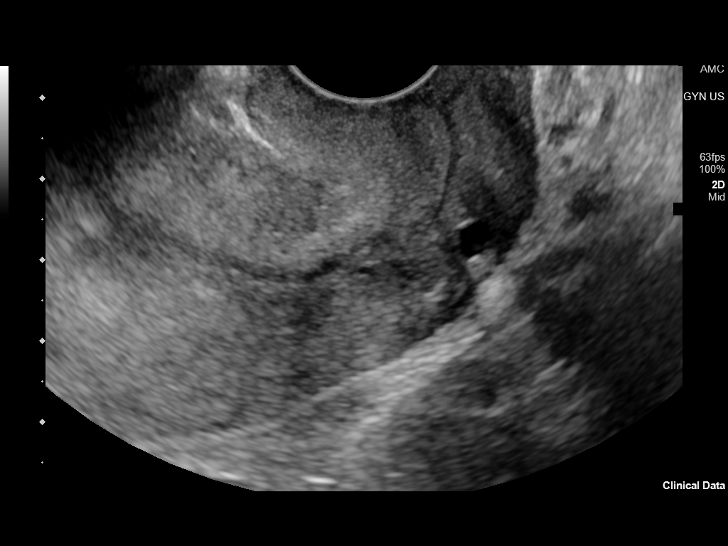
[im 7/25]
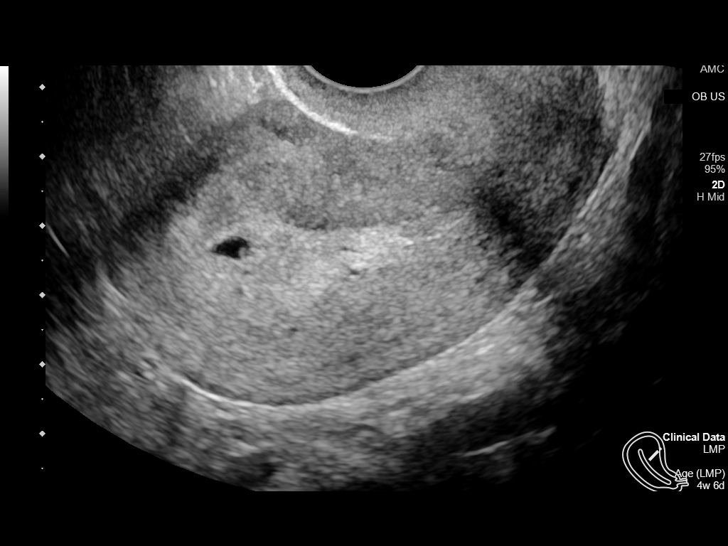
[im 9/25]
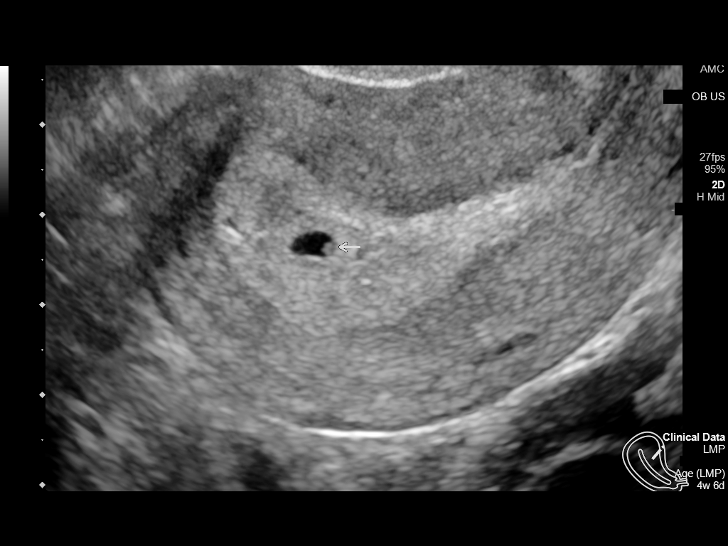
[im 11/25]
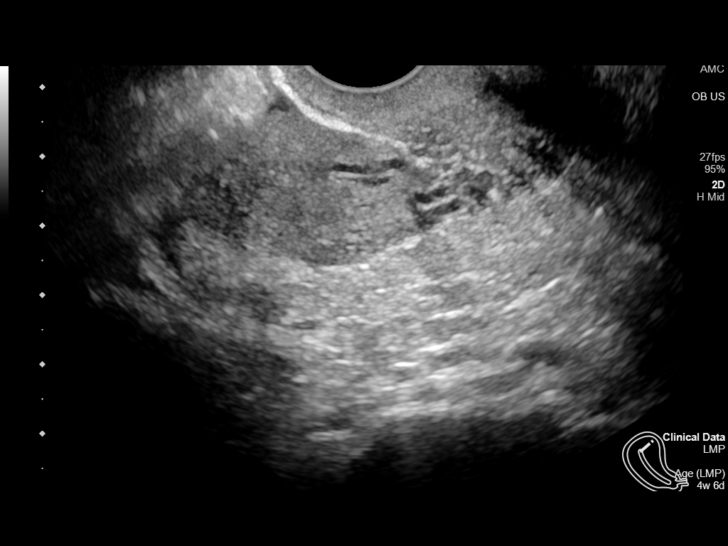
[im 13/25]
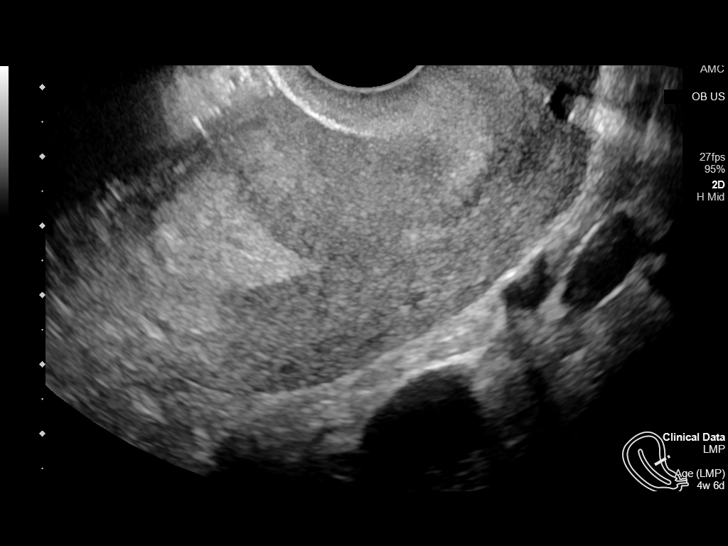
[im 15/25]
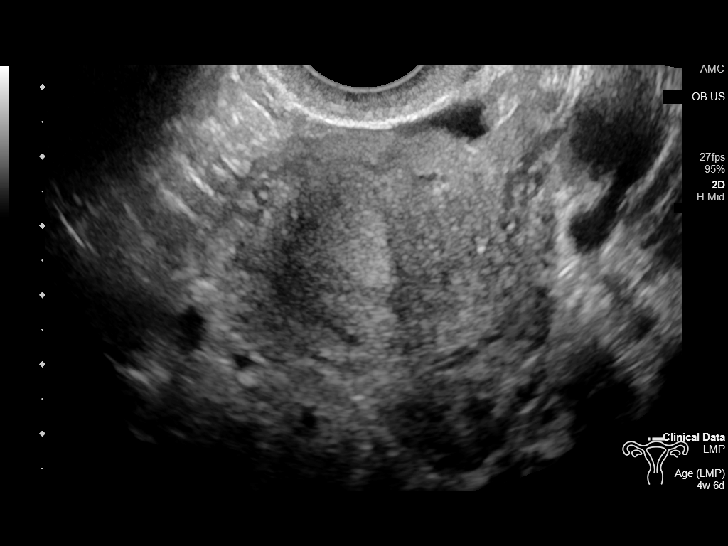
[im 17/25]
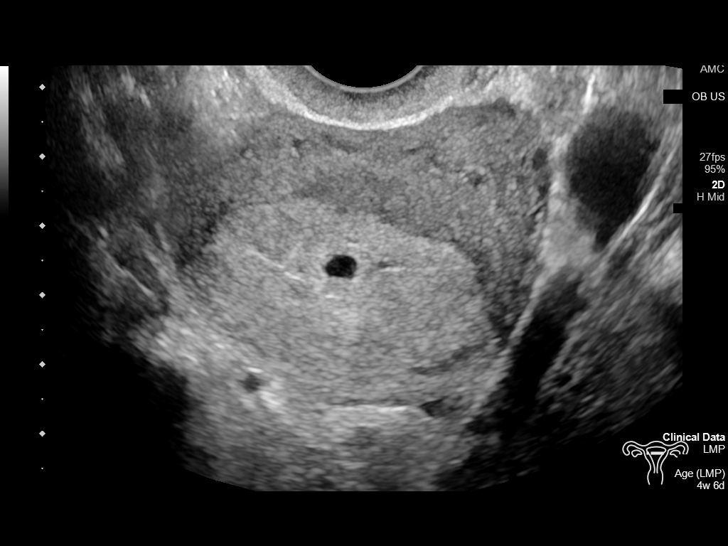
[im 19/25]
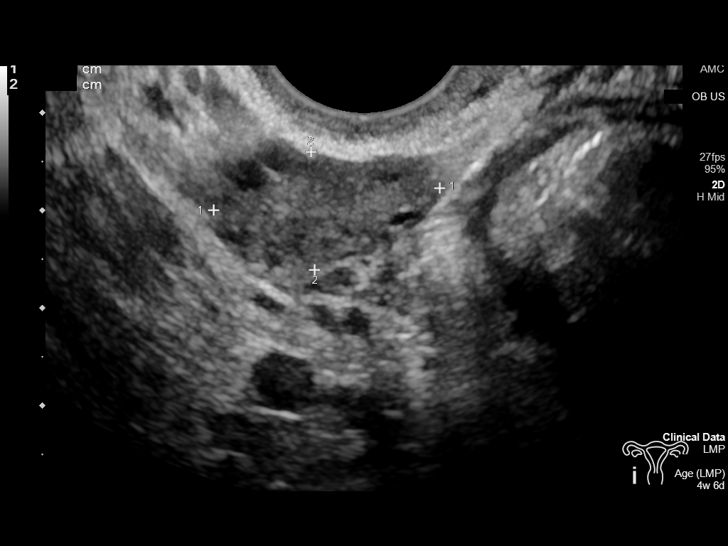
[im 21/25]
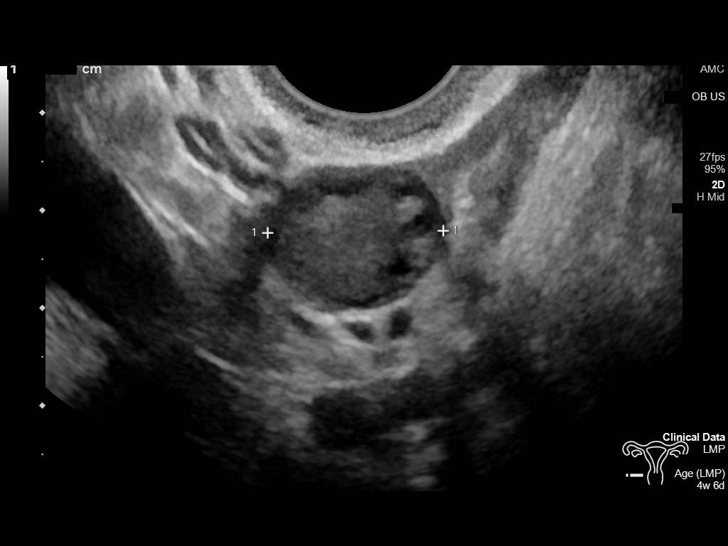
[im 23/25]
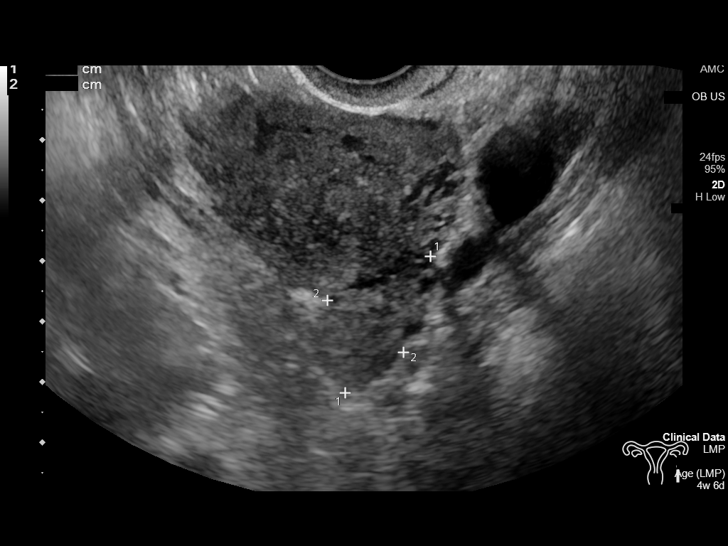
[im 25/25]
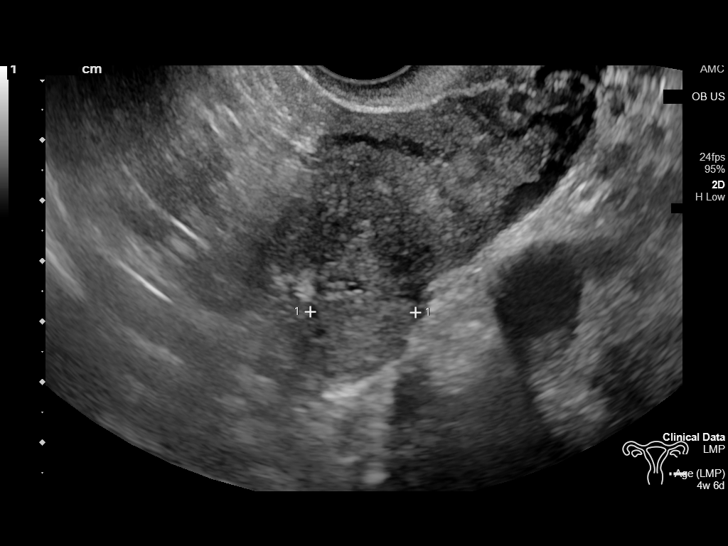

[13 of 25 positions shown; findings below may reference images not displayed]

FINDINGS: LMP: 03/30/2019

GA by LMP: 4 w  6 d

EDC by LMP: 01/04/2020

Intrauterine gestational sac: Single

Yolk sac: Possible early yolk sac seen in the inferior pole of the
gestational sac.

Embryo:  Not Visualized.

Cardiac Activity: Not Visualized.

MSD: 4.8 mm   5 w   2 d

Subchorionic hemorrhage:  None visualized.

Maternal uterus/adnexae: Normal appearance of the maternal uterus
and ovaries.
IMPRESSION: Probable early intrauterine gestational sac, with a structure likely
reflecting an early yolk sac. No fetal pole, or cardiac activity yet
visualized. Recommend follow-up quantitative B-HCG levels and
follow-up US in 14 days to assess viability. This recommendation
follows SRU consensus guidelines: Diagnostic Criteria for Nonviable
Pregnancy Early in the First Trimester. N Engl J Med 8128;

## 2021-01-18 DIAGNOSIS — O0992 Supervision of high risk pregnancy, unspecified, second trimester: Secondary | ICD-10-CM | POA: Insufficient documentation

## 2021-01-18 DIAGNOSIS — O0993 Supervision of high risk pregnancy, unspecified, third trimester: Secondary | ICD-10-CM | POA: Insufficient documentation

## 2021-02-15 ENCOUNTER — Observation Stay: Payer: Managed Care, Other (non HMO) | Admitting: Anesthesiology

## 2021-02-15 ENCOUNTER — Other Ambulatory Visit: Payer: Self-pay

## 2021-02-15 ENCOUNTER — Observation Stay
Admission: EM | Admit: 2021-02-15 | Discharge: 2021-02-15 | Disposition: A | Discharge status: Home / Self Care | Payer: Managed Care, Other (non HMO) | Attending: Obstetrics and Gynecology | Admitting: Obstetrics and Gynecology

## 2021-02-15 ENCOUNTER — Encounter
Admission: EM | Disposition: A | Discharge status: Home / Self Care | Payer: Self-pay | Source: Home / Self Care | Attending: Emergency Medicine

## 2021-02-15 ENCOUNTER — Emergency Department: Payer: Managed Care, Other (non HMO)

## 2021-02-15 DIAGNOSIS — O021 Missed abortion: Secondary | ICD-10-CM | POA: Diagnosis not present

## 2021-02-15 DIAGNOSIS — O039 Complete or unspecified spontaneous abortion without complication: Secondary | ICD-10-CM

## 2021-02-15 HISTORY — PX: DILATION AND EVACUATION: SHX1459

## 2021-02-15 HISTORY — DX: Hypothyroidism, unspecified: E03.9

## 2021-02-15 HISTORY — DX: Opioid use, unspecified, uncomplicated: F11.90

## 2021-02-15 LAB — POC URINE PREG, ED: Preg Test, Ur: POSITIVE — AB

## 2021-02-15 LAB — BASIC METABOLIC PANEL
Anion gap: 6 (ref 5–15)
BUN: 9 mg/dL (ref 6–20)
CO2: 22 mmol/L (ref 22–32)
Calcium: 9 mg/dL (ref 8.9–10.3)
Chloride: 107 mmol/L (ref 98–111)
Creatinine, Ser: 0.72 mg/dL (ref 0.44–1.00)
GFR, Estimated: >60 mL/min (ref >60)
Glucose, Bld: 106 mg/dL — ABNORMAL HIGH (ref 70–99)
Potassium: 3.6 mmol/L (ref 3.5–5.1)
Sodium: 135 mmol/L (ref 135–145)

## 2021-02-15 LAB — CBC
HCT: 42 % (ref 36.0–46.0)
Hemoglobin: 13.7 g/dL (ref 12.0–15.0)
MCH: 28.7 pg (ref 26.0–34.0)
MCHC: 32.6 g/dL (ref 30.0–36.0)
MCV: 88.1 fL (ref 80.0–100.0)
Platelets: 245 10*3/uL (ref 150–400)
RBC: 4.77 MIL/uL (ref 3.87–5.11)
RDW: 13.6 % (ref 11.5–15.5)
WBC: 8.7 10*3/uL (ref 4.0–10.5)
nRBC: 0 % (ref 0.0–0.2)

## 2021-02-15 LAB — HCG, QUANTITATIVE, PREGNANCY: hCG, Beta Chain, Quant, S: 5961 m[IU]/mL — ABNORMAL HIGH (ref <5)

## 2021-02-15 LAB — ANTIBODY SCREEN: Antibody Screen: NEGATIVE

## 2021-02-15 SURGERY — DILATION AND EVACUATION, UTERUS
Anesthesia: General

## 2021-02-15 MED ORDER — 0.9 % SODIUM CHLORIDE (POUR BTL) OPTIME
TOPICAL | Status: DC | PRN
  Administered 2021-02-15: 200 mL

## 2021-02-15 MED ORDER — OXYCODONE HCL 5 MG PO TABS: 5.0000 mg | ORAL_TABLET | Freq: Once | ORAL | Status: AC | PRN

## 2021-02-15 MED ORDER — ONDANSETRON HCL 4 MG/2ML IJ SOLN
INTRAMUSCULAR | Status: DC | PRN
Start: 2021-02-15 — End: 2021-02-15
  Administered 2021-02-15: 4 mg via INTRAVENOUS

## 2021-02-15 MED ORDER — PROPOFOL 10 MG/ML IV BOLUS
INTRAVENOUS | Status: DC | PRN
Start: 2021-02-15 — End: 2021-02-15
  Administered 2021-02-15: 200 mg via INTRAVENOUS
  Administered 2021-02-15: 50 mg via INTRAVENOUS

## 2021-02-15 MED ORDER — METHYLERGONOVINE MALEATE 0.2 MG/ML IJ SOLN
INTRAMUSCULAR | Status: AC
  Filled 2021-02-15: qty 1

## 2021-02-15 MED ORDER — LACTATED RINGERS IV SOLN: INTRAVENOUS | Status: DC | PRN

## 2021-02-15 MED ORDER — SILVER NITRATE-POT NITRATE 75-25 % EX MISC
CUTANEOUS | Status: AC
  Filled 2021-02-15: qty 10

## 2021-02-15 MED ORDER — ACETAMINOPHEN 10 MG/ML IV SOLN
INTRAVENOUS | Status: DC | PRN
  Administered 2021-02-15: 1000 mg via INTRAVENOUS

## 2021-02-15 MED ORDER — PROPOFOL 10 MG/ML IV BOLUS
INTRAVENOUS | Status: AC
  Filled 2021-02-15: qty 20

## 2021-02-15 MED ORDER — RHO D IMMUNE GLOBULIN 1500 UNIT/2ML IJ SOSY
300.0000 ug | PREFILLED_SYRINGE | Freq: Once | INTRAMUSCULAR | Status: AC
Start: 2021-02-15 — End: 2021-02-15
  Administered 2021-02-15: 300 ug via INTRAMUSCULAR
  Filled 2021-02-15: qty 2

## 2021-02-15 MED ORDER — FENTANYL CITRATE (PF) 100 MCG/2ML IJ SOLN
25.0000 ug | INTRAMUSCULAR | Status: AC | PRN
  Administered 2021-02-15: 50 ug via INTRAVENOUS
  Administered 2021-02-15 (×2): 25 ug via INTRAVENOUS
  Administered 2021-02-15: 50 ug via INTRAVENOUS

## 2021-02-15 MED ORDER — LIDOCAINE HCL (CARDIAC) PF 100 MG/5ML IV SOSY
PREFILLED_SYRINGE | INTRAVENOUS | Status: DC | PRN
  Administered 2021-02-15: 100 mg via INTRAVENOUS

## 2021-02-15 MED ORDER — ACETAMINOPHEN 500 MG PO TABS
ORAL_TABLET | ORAL | Status: AC
  Filled 2021-02-15: qty 2

## 2021-02-15 MED ORDER — KETOROLAC TROMETHAMINE 30 MG/ML IJ SOLN
INTRAMUSCULAR | Status: DC | PRN
  Administered 2021-02-15: 15 mg via INTRAVENOUS

## 2021-02-15 MED ORDER — FENTANYL CITRATE (PF) 100 MCG/2ML IJ SOLN
INTRAMUSCULAR | Status: DC | PRN
  Administered 2021-02-15: 100 ug via INTRAVENOUS

## 2021-02-15 MED ORDER — CEFAZOLIN SODIUM-DEXTROSE 2-4 GM/100ML-% IV SOLN
INTRAVENOUS | Status: AC
  Filled 2021-02-15: qty 100

## 2021-02-15 MED ORDER — ACETAMINOPHEN 10 MG/ML IV SOLN
INTRAVENOUS | Status: AC
  Filled 2021-02-15: qty 100

## 2021-02-15 MED ORDER — OXYCODONE HCL 5 MG/5ML PO SOLN: 5.0000 mg | Freq: Once | ORAL | Status: AC | PRN

## 2021-02-15 MED ORDER — CEFAZOLIN SODIUM-DEXTROSE 2-4 GM/100ML-% IV SOLN
2.0000 g | Freq: Once | INTRAVENOUS | Status: AC
  Administered 2021-02-15: 2 g via INTRAVENOUS

## 2021-02-15 MED ORDER — FENTANYL CITRATE (PF) 100 MCG/2ML IJ SOLN
INTRAMUSCULAR | Status: AC
  Administered 2021-02-15: 25 ug via INTRAVENOUS
  Filled 2021-02-15: qty 2

## 2021-02-15 MED ORDER — RHO D IMMUNE GLOBULIN 1500 UNIT/2ML IJ SOSY
300.0000 ug | PREFILLED_SYRINGE | Freq: Once | INTRAMUSCULAR | Status: DC
  Filled 2021-02-15: qty 2

## 2021-02-15 MED ORDER — DEXAMETHASONE SODIUM PHOSPHATE 10 MG/ML IJ SOLN
INTRAMUSCULAR | Status: DC | PRN
  Administered 2021-02-15: 10 mg via INTRAVENOUS

## 2021-02-15 MED ORDER — OXYCODONE HCL 5 MG PO TABS
ORAL_TABLET | ORAL | Status: AC
  Administered 2021-02-15: 5 mg via ORAL
  Filled 2021-02-15: qty 1

## 2021-02-15 MED ORDER — FENTANYL CITRATE (PF) 100 MCG/2ML IJ SOLN
INTRAMUSCULAR | Status: AC
  Filled 2021-02-15: qty 2

## 2021-02-15 MED ORDER — MIDAZOLAM HCL 2 MG/2ML IJ SOLN
INTRAMUSCULAR | Status: AC
  Filled 2021-02-15: qty 2

## 2021-02-15 MED ORDER — ONDANSETRON HCL 4 MG/2ML IJ SOLN
INTRAMUSCULAR | Status: AC
  Filled 2021-02-15: qty 2

## 2021-02-15 MED ORDER — ACETAMINOPHEN 500 MG PO TABS
1000.0000 mg | ORAL_TABLET | Freq: Once | ORAL | Status: AC
  Administered 2021-02-15: 1000 mg via ORAL

## 2021-02-15 MED ORDER — DEXAMETHASONE SODIUM PHOSPHATE 10 MG/ML IJ SOLN
INTRAMUSCULAR | Status: AC
  Filled 2021-02-15: qty 1

## 2021-02-15 MED ORDER — MIDAZOLAM HCL 2 MG/2ML IJ SOLN
INTRAMUSCULAR | Status: DC | PRN
  Administered 2021-02-15: 2 mg via INTRAVENOUS

## 2021-02-15 SURGICAL SUPPLY — 31 items
BACTOSHIELD CHG 4% 4OZ (MISCELLANEOUS) ×1
DRSG TELFA 3X8 NADH (GAUZE/BANDAGES/DRESSINGS) ×2 IMPLANT
FILTER UTR ASPR ASSEMBLY (MISCELLANEOUS) ×2 IMPLANT
FILTER UTR ASPR SPEC (MISCELLANEOUS) ×1 IMPLANT
FLTR UTR ASPR SPEC (MISCELLANEOUS) ×2
GAUZE 4X4 16PLY ~~LOC~~+RFID DBL (SPONGE) ×2 IMPLANT
GLOVE SURG SYN 8.0 (GLOVE) ×2 IMPLANT
GLOVE SURG SYN 8.0 PF PI (GLOVE) ×1 IMPLANT
GOWN STRL REUS W/ TWL LRG LVL3 (GOWN DISPOSABLE) ×1 IMPLANT
GOWN STRL REUS W/ TWL XL LVL3 (GOWN DISPOSABLE) ×1 IMPLANT
GOWN STRL REUS W/TWL LRG LVL3 (GOWN DISPOSABLE) ×2
GOWN STRL REUS W/TWL XL LVL3 (GOWN DISPOSABLE) ×2
KIT BERKELEY 1ST TRIMESTER 3/8 (MISCELLANEOUS) ×1 IMPLANT
KIT TURNOVER CYSTO (KITS) ×2 IMPLANT
MANIFOLD NEPTUNE II (INSTRUMENTS) ×2 IMPLANT
PACK DNC HYST (MISCELLANEOUS) ×2 IMPLANT
PAD DRESSING TELFA 3X8 NADH (GAUZE/BANDAGES/DRESSINGS) IMPLANT
PAD OB MATERNITY 4.3X12.25 (PERSONAL CARE ITEMS) ×2 IMPLANT
PAD PREP 24X41 OB/GYN DISP (PERSONAL CARE ITEMS) ×2 IMPLANT
SCRUB CHG 4% DYNA-HEX 4OZ (MISCELLANEOUS) ×1 IMPLANT
SET BERKELEY SUCTION TUBING (SUCTIONS) ×2 IMPLANT
SET CYSTO W/LG BORE CLAMP LF (SET/KITS/TRAYS/PACK) IMPLANT
TOWEL OR 17X26 4PK STRL BLUE (TOWEL DISPOSABLE) ×2 IMPLANT
TRAP TISSUE FILTER (MISCELLANEOUS) ×2 IMPLANT
VACURETTE 10 RIGID CVD (CANNULA) ×2 IMPLANT
VACURETTE 6 ASPIR F TIP BERK (CANNULA) ×2 IMPLANT
VACURETTE 7MM F TIP (CANNULA)
VACURETTE 7MM F TIP STRL (CANNULA) ×1 IMPLANT
VACURETTE 8 RIGID CVD (CANNULA) ×1 IMPLANT
VACURETTE 8MM F TIP (MISCELLANEOUS) ×2 IMPLANT
WATER STERILE IRR 500ML POUR (IV SOLUTION) ×2 IMPLANT

## 2021-02-15 NOTE — ED Triage Notes (Signed)
Pt come with c/o vaginal bleeding that started last night. Pt is [redacted] weeks pregnant. Pt denies any cramping. Pt states it was dark brown.

## 2021-02-15 NOTE — Transfer of Care (Addendum)
Immediate Anesthesia Transfer of Care Note  Patient: Megan Hopkins  Procedure(s) Performed: DILATATION AND EVACUATION  Patient Location: PACU  Anesthesia Type:General  Level of Consciousness: awake and drowsy  Airway & Oxygen Therapy: Patient Spontanous Breathing  Post-op Assessment: Report given to RN  Post vital signs: stable  Last Vitals:  Vitals Value Taken Time  BP    Temp    Pulse    Resp    SpO2      Last Pain:  Vitals:   02/15/21 1533  TempSrc: Oral  PainSc:          Complications: No notable events documented.

## 2021-02-15 NOTE — Discharge Instructions (Signed)

## 2021-02-15 NOTE — ED Notes (Signed)
Report given to OR.

## 2021-02-15 NOTE — Anesthesia Preprocedure Evaluation (Addendum)
Anesthesia Evaluation  Patient identified by MRN, date of birth, ID band Patient awake    Reviewed: Allergy & Precautions, NPO status , Patient's Chart, lab work & pertinent test results  History of Anesthesia Complications Negative for: history of anesthetic complications  Airway Mallampati: II  TM Distance: >3 FB Neck ROM: full    Dental  (+) Chipped   Pulmonary neg shortness of breath, Current Smoker and Patient abstained from smoking.,    Pulmonary exam normal        Cardiovascular Exercise Tolerance: Good (-) angina(-) Past MI Normal cardiovascular exam     Neuro/Psych  Headaches, PSYCHIATRIC DISORDERS    GI/Hepatic negative GI ROS, Neg liver ROS, neg GERD  ,  Endo/Other  Hypothyroidism   Renal/GU      Musculoskeletal   Abdominal   Peds  Hematology negative hematology ROS (+)   Anesthesia Other Findings Past Medical History: No date: Anxiety No date: Bronchitis     Comment:  hx of No date: Hypothyroidism No date: Migraine     Comment:  hx of No date: Opioid use disorder No date: Pneumonia  Past Surgical History: No date: cervical colon biospy 2022: DILATION AND CURETTAGE OF UTERUS     Comment:  missed Ab, performed in [facility] 05/12/2014: INCISION AND DRAINAGE ABSCESS; Left     Comment:  Procedure: INCISION AND DRAINAGE ABSCESS;  Surgeon:               Duwaine Maxin, MD;  Location: [facility] ORS;  Service:               General;  Laterality: Left; No date: KNEE ARTHROSCOPY     Comment:  left No date: LAPAROSCOPIC ENDOMETRIOSIS FULGURATION  BMI    Body Mass Index: 33.15 kg/m      Reproductive/Obstetrics negative OB ROS                             Anesthesia Physical Anesthesia Plan  ASA: 3  Anesthesia Plan: General ETT   Post-op Pain Management:    Induction: Intravenous  PONV Risk Score and Plan: Dexamethasone, Ondansetron, Midazolam and Treatment may vary  due to age or medical condition  Airway Management Planned: Oral ETT  Additional Equipment:   Intra-op Plan:   Post-operative Plan: Extubation in OR  Informed Consent: I have reviewed the patients History and Physical, chart, labs and discussed the procedure including the risks, benefits and alternatives for the proposed anesthesia with the patient or authorized representative who has indicated his/her understanding and acceptance.     Dental Advisory Given  Plan Discussed with: Anesthesiologist, CRNA and Surgeon  Anesthesia Plan Comments: (Patient consented for risks of anesthesia including but not limited to:  - adverse reactions to medications - damage to eyes, teeth, lips or other oral mucosa - nerve damage due to positioning  - sore throat or hoarseness - Damage to heart, brain, nerves, lungs, other parts of body or loss of life  Patient voiced understanding.)       Anesthesia Quick Evaluation

## 2021-02-15 NOTE — ED Notes (Signed)
See triage note  presents with some vaginal bleeding   states she is [redacted] weeks pregnant

## 2021-02-15 NOTE — Brief Op Note (Signed)
02/15/2021  5:42 PM  PATIENT:  Annitta Jersey  36 y.o. female  PRE-OPERATIVE DIAGNOSIS:  Missed Abortion   POST-OPERATIVE DIAGNOSIS:  missed abortion   PROCEDURE:  Procedure(s): DILATATION AND EVACUATION (N/A)  SURGEON:  Surgeon(s) and Role:    * Shaylinn Hladik, Ihor Austin, MD - Primary  PHYSICIAN ASSISTANT:   ASSISTANTS: none   ANESTHESIA:   LMA  EBL:  75 mL IOF 900 cc UO 100 cc  BLOOD ADMINISTERED:none  DRAINS: none   LOCAL MEDICATIONS USED:  NONE  SPECIMEN:  Source of Specimen:  POC  DISPOSITION OF SPECIMEN:  PATHOLOGY  COUNTS:  YES  TOURNIQUET:  * No tourniquets in log *  DICTATION: .Other Dictation: Dictation Number verbal  PLAN OF CARE: Discharge to home after PACU  PATIENT DISPOSITION:  PACU - hemodynamically stable.   Delay start of Pharmacological VTE agent (>24hrs) due to surgical blood loss or risk of bleeding: not applicable

## 2021-02-15 NOTE — Consult Note (Signed)
Consult History and Physical   SERVICE: Obstetrics/Gynecology   Patient Name: Megan Hopkins Patient MRN:   [id number]  CC: brown spotting starting last night  HPI: Megan Hopkins is a 36 y.o. G3P1011 at [redacted]w[redacted]d by early US done on 01/14/21 at [redacted]w[redacted]d. Pt presents with brown discharge and spotting that started last night. Denies having any pain.     Review of Systems: positives in bold GEN:   fevers, chills, weight changes, appetite changes, fatigue, night sweats HEENT:  HA, vision changes, hearing loss, congestion, rhinorrhea, sinus pressure, dysphagia CV:   CP, palpitations PULM:  SOB, cough GI:  abd pain, N/V/D/C GU:  dysuria, urgency, frequency MSK:  arthralgias, myalgias, back pain, swelling SKIN:  rashes, color changes, pallor NEURO:  numbness, weakness, tingling, seizures, dizziness, tremors PSYCH:  depression, anxiety, behavioral problems, confusion  HEME/LYMPH:  easy bruising or bleeding ENDO:  heat/cold intolerance  Past Obstetrical History: OB History     Gravida  3   Para  1   Term  1   Preterm      AB  1   Living  1      SAB  1   IAB      Ectopic      Multiple      Live Births               Past Medical History: Past Medical History:  Diagnosis Date   Anxiety    Bronchitis    hx of   Hypothyroidism    Migraine    hx of   Opioid use disorder    Pneumonia     Past Surgical History:   Past Surgical History:  Procedure Laterality Date   cervical colon biospy     DILATION AND CURETTAGE OF UTERUS  2022   missed Ab, performed in [place]   INCISION AND DRAINAGE ABSCESS Left 05/12/2014   Procedure: INCISION AND DRAINAGE ABSCESS;  Surgeon: Duwaine Maxin, MD;  Location: [facility] ORS;  Service: General;  Laterality: Left;   KNEE ARTHROSCOPY     left   LAPAROSCOPIC ENDOMETRIOSIS FULGURATION      Family History:  family history is not on file.  Social History:  Social History   Socioeconomic History   Marital status: Single     Spouse name: Not on file   Number of children: Not on file   Years of education: Not on file   Highest education level: Not on file  Occupational History   Not on file  Tobacco Use   Smoking status: Every Day    Packs/day: 0.25    Years: 8.00    Pack years: 2.00    Types: Cigarettes   Smokeless tobacco: Never  Vaping Use   Vaping Use: Never used  Substance and Sexual Activity   Alcohol use: No    Comment: occassional   Drug use: No   Sexual activity: Not on file  Other Topics Concern   Not on file  Social History Narrative   Not on file   Social Determinants of Health   Financial Resource Strain: Not on file  Food Insecurity: Not on file  Transportation Needs: Not on file  Physical Activity: Not on file  Stress: Not on file  Social Connections: Not on file  Intimate Partner Violence: Not on file    Home Medications:  Medications reconciled in EPIC  No current facility-administered medications on file prior to encounter.   Current Outpatient Medications on File Prior to  Encounter  Medication Sig Dispense Refill   levothyroxine (SYNTHROID) 75 MCG tablet Take 75 mcg by mouth daily before breakfast.     methadone (DOLOPHINE) 10 MG/ML solution Take 130 mg by mouth daily.      Allergies:  Allergies  Allergen Reactions   Other Anaphylaxis    Horseradish.   Bee Venom Hives and Swelling   Doxycycline Hives and Nausea And Vomiting   Calamine Hives   Macrobid [Nitrofurantoin Monohyd Macro] Rash    Physical Exam:  Temp:  [98.6 F (37 C)] 98.6 F (37 C) (02/06 0908) Pulse Rate:  [96] 96 (02/06 0908) Resp:  [16] 16 (02/06 0908) BP: (141)/(76) 141/76 (02/06 0908) SpO2:  [98 %] 98 % (02/06 0908) Weight:  [98.9 kg] 98.9 kg (02/06 0925)   General Appearance:  Well developed, well nourished, no acute distress, alert and oriented x3; anxious and crying.  HEENT:  Normocephalic atraumatic Cardiovascular:  Normal S1/S2, regular rate and rhythm, no murmurs Pulmonary:   clear to auscultation, no wheezes, rales or rhonchi, symmetric air entry, good air exchange Abdomen:  Bowel sounds present, soft, nontender, nondistended, no abnormal masses, no epigastric pain Extremities:  Full range of motion, no pedal edema, 2+ distal pulses, no tenderness Skin:  normal coloration and turgor, no rashes, no suspicious skin lesions noted  Psychiatric:  Normal mood and affect, appropriate, no AH/VH Pelvic:  deferred   Labs/Studies:   CBC and Coags:  Lab Results  Component Value Date   WBC 8.7 02/15/2021   NEUTOPHILPCT 65% 05/11/2014   EOSPCT 8% 05/11/2014   BASOPCT 0% 05/11/2014   LYMPHOPCT 18% 05/11/2014   HGB 13.7 02/15/2021   HCT 42.0 02/15/2021   MCV 88.1 02/15/2021   PLT 245 02/15/2021   CMP:  Lab Results  Component Value Date   NA 135 02/15/2021   K 3.6 02/15/2021   CL 107 02/15/2021   CO2 22 02/15/2021   BUN 9 02/15/2021   CREATININE 0.72 02/15/2021   CREATININE 1.01 (H) 05/03/2019   CREATININE 0.77 03/18/2016   PROT 7.6 05/03/2019   BILITOT 0.4 05/03/2019   ALT 20 05/03/2019   AST 17 05/03/2019   ALKPHOS 62 05/03/2019   Other Labs: ABO/Rh: O Negative   Other Imaging: US OB Comp Less 14 Wks  Result Date: 02/15/2021 CLINICAL DATA:  Vaginal spotting. EXAM: OBSTETRIC <14 WK ULTRASOUND TECHNIQUE: Transabdominal ultrasound was performed for evaluation of the gestation as well as the maternal uterus and adnexal regions. COMPARISON:  None. FINDINGS: Intrauterine gestational sac: Single Yolk sac:  Not Visualized. Embryo:  Visualized. Cardiac Activity: Not Visualized. MSD:  52.2 mm   11 w   0 d CRL:   29.4 mm   9 w 5 d                  [place] EDC: September 15, 2021. Subchorionic hemorrhage:  None visualized. Maternal uterus/adnexae: Ovaries are unremarkable. No free fluid is noted. IMPRESSION: Findings meet definitive criteria for failed pregnancy. This follows SRU consensus guidelines: Diagnostic Criteria for Nonviable Pregnancy Early in the First Trimester.  [facility] [phone]. Electronically Signed   By: Lupita Raider M.D.   On: 02/15/2021 10:03     Assessment / Plan:   Megan Hopkins is a 36 y.o. G1P0 who presents with missed Ab at [redacted]w[redacted]d  1. D/w Dr Feliberto Gottron, pt counseled regarding pregnancy loss and surgical mgmt recommended.  - pt agrees to [facility].  - last PO intake- iced coffee w/  whipped cream around 10am    Thank you for the opportunity to be involved with this pt's care.  Randa Ngo, CNM 02/15/2021 11:41 AM

## 2021-02-15 NOTE — Progress Notes (Signed)
Notes reviewed .  Missed Ab at 11+6 weeks I have counseled pt for Suction D+C . All question answered . proceed

## 2021-02-15 NOTE — ED Provider Notes (Signed)
° °  [facility] Provider Note    Event Date/Time   First MD Initiated Contact with Patient 02/15/21 [phone]     (approximate)  History   Chief Complaint: Vaginal Bleeding  HPI  Megan Hopkins is a 36 y.o. female G3, P1 A1 approximately [redacted] weeks pregnant presents to the emergency department for vaginal spotting.  According to the patient since last night she has noted very slight amount of dark vaginal spotting.  No history of vaginal bleeding in this pregnancy previously.  Patient denies any abdominal pain or cramping.  No fluid leakage or discharge.  No recent intercourse.  Physical Exam   Triage Vital Signs: ED Triage Vitals  Enc Vitals Group     BP 02/15/21 0908 (!) 141/76     Pulse Rate 02/15/21 0908 96     Resp 02/15/21 0908 16     Temp 02/15/21 0908 98.6 F (37 C)     Temp Source 02/15/21 0908 Oral     SpO2 02/15/21 0908 98 %     Weight --      Height --      Head Circumference --      Peak Flow --      Pain Score 02/15/21 0902 0     Pain Loc --      Pain Edu? --      Excl. in GC? --     Most recent vital signs: Vitals:   02/15/21 0908  BP: (!) 141/76  Pulse: 96  Resp: 16  Temp: 98.6 F (37 C)  SpO2: 98%    General: Awake, no distress.  CV:  Good peripheral perfusion.  Regular rate and rhythm  Resp:  Normal effort.  Equal breath sounds bilaterally.  Abd:  No distention.  Soft, nontender.  No rebound or guarding.   ED Results / Procedures / Treatments   RADIOLOGY  Ultrasound consistent with failed pregnancy   MEDICATIONS ORDERED IN ED: Medications - No data to display   IMPRESSION / MDM / ASSESSMENT AND PLAN / ED COURSE  I reviewed the triage vital signs and the nursing notes.  Patient presents to the emergency department for vaginal spotting since last night.  States it is mild.  Patient is approximate [redacted] weeks pregnant.  States she has already had a confirmatory ultrasound at 7 or 8 weeks.  Differential today would include  subchorionic hemorrhage/hematoma, miscarriage, threatened miscarriage.  We will obtain lab work, ultrasound and continue to closely monitor.  Patient agreeable to plan of care.  I reviewed the patient's OB/GYN note from 01/18/2021 with the patient was seen in the office approximately 7.[redacted] weeks pregnant by ultrasound with a fetal heart rate noted at 148 bpm at that time.  Patient's ultrasound unfortunately is consistent with fetal demise measuring 11 weeks 0 days with no heartbeat.  I spoke to Dr. Feliberto Gottron, the midwife is currently seeing the patient.  Patient was understandably upset with the news when I told her.  Will likely require a D&C.  Patient is O- and will require RhoGAM as well (midwife is ordering for the patient).  OB/GYN will be taking him to the operating room for a D&C.  FINAL CLINICAL IMPRESSION(S) / ED DIAGNOSES   Miscarriage  Note:  This document was prepared using Dragon voice recognition software and may include unintentional dictation errors.   [name], MD 02/15/21 1501

## 2021-02-16 ENCOUNTER — Encounter: Payer: Self-pay | Admitting: Obstetrics and Gynecology

## 2021-02-16 LAB — RHOGAM INJECTION: Unit division: 0

## 2021-02-16 LAB — ABO/RH: ABO/RH(D): O NEG

## 2021-02-16 NOTE — Anesthesia Postprocedure Evaluation (Signed)
Anesthesia Post Note  Patient: Megan Hopkins  Procedure(s) Performed: DILATATION AND EVACUATION  Patient location during evaluation: PACU Anesthesia Type: General Level of consciousness: awake and alert Pain management: pain level controlled Vital Signs Assessment: post-procedure vital signs reviewed and stable Respiratory status: spontaneous breathing, nonlabored ventilation, respiratory function stable and patient connected to nasal cannula oxygen Cardiovascular status: blood pressure returned to baseline and stable Postop Assessment: no apparent nausea or vomiting Anesthetic complications: no   No notable events documented.   Last Vitals:  Vitals:   02/15/21 1850 02/15/21 1910  BP: 124/76 134/74  Pulse: 79 79  Resp: (!) 27 20  Temp: 37.2 C 36.7 C  SpO2: 96% 99%    Last Pain:  Vitals:   02/15/21 1910  TempSrc: Temporal  PainSc: 2                  Yevette Edwards

## 2021-02-16 NOTE — Op Note (Signed)
NAME: Megan Hopkins, Megan Hopkins. MEDICAL RECORD NO: [id number] ACCOUNT NO: [id number] DATE OF BIRTH: July 23, 1985 FACILITY: [facility] LOCATION: [facility] PHYSICIAN: Boykin Nearing, MD  Operative Report   DATE OF PROCEDURE: 02/15/2021  PREOPERATIVE DIAGNOSIS:  Missed abortion.  POSTOPERATIVE DIAGNOSIS:  Missed abortion.  PROCEDURE:  Suction dilation and curettage.  SURGEON:  Boykin Nearing, MD  INDICATIONS:  A 36 year old gravida 3, para 1, patient at 11+6 weeks estimated gestational age based on a 7+2 weeks ultrasound performed on 01/14/2021.  The patient came in with brownish vaginal discharge on the day of the procedure and the Emergency  Department evaluated the patient and performed an ultrasound that showed a crown rump length of 9 weeks 5 days with no fetal cardiac activity.  The patient's blood type O negative.  DESCRIPTION OF PROCEDURE:  After adequate laryngeal mask anesthesia, the patient was placed in a dorsal supine position, legs placed in the candy cane stirrups.  The patient was prepped and draped in normal sterile fashion.  Timeout was performed.  The  patient did receive 2 grams of IV Ancef prior to commencement of the case due to her doxycycline allergy.  Speculum was placed into the vagina after draining the bladder, yielding 100 mL urine.  The cervix was grasped with a single tooth tenaculum on the  anterior cervix and cervix was then dilated to a #20 Hanks dilator without difficulty.  A #8 flexible suction curette was placed in the endometrial cavity and several passes were performed with significant tissue consistent with products of conception.   Randall stone forceps utilized to remove excess products of conception.  Sharp curettage performed with good uterine cry throughout and repeat suction curette with no additional tissue.  Good hemostasis noted.  There were no complications.  The patient  tolerated the procedure well.  ESTIMATED BLOOD LOSS:  75  mL  INTRAOPERATIVE FLUIDS:  900 mL  URINE OUTPUT:  100 mL  The patient was taken to recovery in good condition.   VAI D: 02/15/2021 5:53:28 pm T: 02/16/2021 12:10:00 am  JOB: [id number]

## 2021-02-17 LAB — SURGICAL PATHOLOGY

## 2021-02-18 NOTE — Discharge Summary (Signed)
GYN discharge summary: pt place in observation for missed Ab   Date of admission 02/15/21 Date of discharge 02/15/21  Admission diagnosis missed ab Discharge diagnosis missed ,Ab  Procedure:suction [facility] course : Pt seen in ed for vaginal bleeding . U/s demonstrates (9+4 weeks  and no FHM    Studies: u/s as above   Labs :Bllood type O neg   Disposition :home , good condition   Follow up in 2 weeks with Dr Feliberto Gottron

## 2021-11-03 ENCOUNTER — Inpatient Hospital Stay: Admission: RE | Admit: 2021-11-03 | Payer: Managed Care, Other (non HMO) | Source: Ambulatory Visit

## 2022-01-27 ENCOUNTER — Encounter
Admission: RE | Admit: 2022-01-27 | Discharge: 2022-01-27 | Disposition: A | Discharge status: Home / Self Care | Payer: Medicaid Other | Source: Ambulatory Visit
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
## 2022-01-29 ENCOUNTER — Encounter: Payer: Self-pay | Admitting: Obstetrics and Gynecology

## 2022-01-29 ENCOUNTER — Other Ambulatory Visit: Payer: Self-pay

## 2022-01-29 ENCOUNTER — Other Ambulatory Visit: Payer: Self-pay | Admitting: Obstetrics

## 2022-01-29 ENCOUNTER — Observation Stay
Admission: EM | Admit: 2022-01-29 | Discharge: 2022-01-30 | Disposition: A | Discharge status: Home / Self Care | Payer: Medicaid Other | Attending: Obstetrics | Admitting: Obstetrics

## 2022-01-29 DIAGNOSIS — E039 Hypothyroidism, unspecified: Secondary | ICD-10-CM | POA: Diagnosis not present

## 2022-01-29 DIAGNOSIS — O99333 Smoking (tobacco) complicating pregnancy, third trimester: Secondary | ICD-10-CM | POA: Diagnosis not present

## 2022-01-29 DIAGNOSIS — F17210 Nicotine dependence, cigarettes, uncomplicated: Secondary | ICD-10-CM | POA: Insufficient documentation

## 2022-01-29 DIAGNOSIS — O09523 Supervision of elderly multigravida, third trimester: Secondary | ICD-10-CM | POA: Insufficient documentation

## 2022-01-29 DIAGNOSIS — O4703 False labor before 37 completed weeks of gestation, third trimester: Principal | ICD-10-CM | POA: Diagnosis present

## 2022-01-29 DIAGNOSIS — Z79899 Other long term (current) drug therapy: Secondary | ICD-10-CM | POA: Insufficient documentation

## 2022-01-29 DIAGNOSIS — Z3A31 31 weeks gestation of pregnancy: Secondary | ICD-10-CM | POA: Diagnosis not present

## 2022-01-29 DIAGNOSIS — O99283 Endocrine, nutritional and metabolic diseases complicating pregnancy, third trimester: Secondary | ICD-10-CM | POA: Diagnosis not present

## 2022-01-29 LAB — URINALYSIS, ROUTINE W REFLEX MICROSCOPIC
Bacteria, UA: NONE SEEN
Bilirubin Urine: NEGATIVE
Glucose, UA: NEGATIVE mg/dL
Hgb urine dipstick: NEGATIVE
Ketones, ur: NEGATIVE mg/dL
Nitrite: NEGATIVE
Protein, ur: 30 mg/dL — AB
Specific Gravity, Urine: 1.026 (ref 1.005–1.030)
pH: 6 (ref 5.0–8.0)

## 2022-01-29 LAB — WET PREP, GENITAL
Sperm: NONE SEEN
Trich, Wet Prep: NONE SEEN
WBC, Wet Prep HPF POC: <10 (ref <10)
Yeast Wet Prep HPF POC: NONE SEEN

## 2022-01-29 LAB — URINE DRUG SCREEN, QUALITATIVE (ARMC ONLY)
Amphetamines, Ur Screen: NOT DETECTED
Barbiturates, Ur Screen: NOT DETECTED
Benzodiazepine, Ur Scrn: NOT DETECTED
Cannabinoid 50 Ng, Ur ~~LOC~~: NOT DETECTED
Cocaine Metabolite,Ur ~~LOC~~: NOT DETECTED
MDMA (Ecstasy)Ur Screen: NOT DETECTED
Methadone Scn, Ur: POSITIVE — AB
Opiate, Ur Screen: NOT DETECTED
Phencyclidine (PCP) Ur S: NOT DETECTED
Tricyclic, Ur Screen: NOT DETECTED

## 2022-01-29 MED ORDER — LACTATED RINGERS IV BOLUS: 1000.0000 mL | Freq: Once | INTRAVENOUS | Status: DC

## 2022-01-29 MED ORDER — METRONIDAZOLE 500 MG PO TABS: 500.0000 mg | ORAL_TABLET | Freq: Two times a day (BID) | ORAL | 0 refills | Status: AC

## 2022-01-29 MED ORDER — METRONIDAZOLE 500 MG PO TABS
500.0000 mg | ORAL_TABLET | Freq: Two times a day (BID) | ORAL | Status: DC
  Administered 2022-01-29: 500 mg via ORAL
  Filled 2022-01-29: qty 1

## 2022-01-29 NOTE — Discharge Summary (Signed)
Megan Hopkins is a 37 y.o. female. She is at Unknown gestation. No LMP recorded. Patient is pregnant. Estimated Date of Delivery: None noted.  Prenatal care site: [facility]  Chief complaint: uterine contractions and pelvic pressure  HPI: Megan Hopkins presents to L&D with complaints of uterine contractions every 10 mins and pelvic pressure  Factors complicating pregnancy: Tobacco use in pregnancy Hx of preterm delivery at 36 weeks SAB x 3 Opioid use disorder on Methadone Hypothyroidism RH neg  S: Resting comfortably. no CTX, no VB.no LOF,  Active fetal movement.   Maternal Medical History:  Past Medical Hx:  has a past medical history of Anxiety, Bronchitis, Hypothyroidism, Migraine, Opioid use disorder, and Pneumonia.    Past Surgical Hx:  has a past surgical history that includes cervical colon biospy; Knee arthroscopy; Laparoscopic endometriosis fulguration; Incision and drainage abscess (Left, 05/12/2014); Dilation and curettage of uterus (2022); and Dilation and evacuation (N/A, 02/15/2021).   Allergies  Allergen Reactions   Other Anaphylaxis    Horseradish.   Bee Venom Hives and Swelling   Doxycycline Hives and Nausea And Vomiting   Calamine Hives   Macrobid [Nitrofurantoin Monohyd Macro] Rash     Prior to Admission medications   Medication Sig Start Date End Date Taking? Authorizing Provider  levothyroxine (SYNTHROID) 75 MCG tablet Take 75 mcg by mouth daily before breakfast.   Yes [provider]  methadone (DOLOPHINE) 10 MG/ML solution Take 130 mg by mouth daily.   Yes [provider]  metroNIDAZOLE (FLAGYL) 500 MG tablet Take 1 tablet (500 mg total) by mouth 2 (two) times daily for 7 days. 01/29/22 02/05/22 Yes Ed Blalock, CNM  atorvastatin (LIPITOR) 10 MG tablet Take 10 mg by mouth daily. Patient not taking: Reported on 01/29/2022    [provider]    Social History: She  reports that she has been smoking  cigarettes. She has a 2.00 pack-year smoking history. She has never used smokeless tobacco. She reports that she does not drink alcohol and does not use drugs.  Family History: family history is not on file. ,no history of gyn cancers  Review of Systems: A full review of systems was performed and negative except as noted in the HPI.    O:  BP 118/74 (BP Location: Right Arm)   Pulse 91   Temp 97.9 F (36.6 C) (Axillary)   Resp 19   Ht 5\' 8"  (1.727 m)   Wt 109.9 kg   BMI 36.83 kg/m  Results for orders placed or performed during the hospital encounter of 01/29/22 (from the past 48 hour(s))  Urinalysis, Routine w reflex microscopic   Collection Time: 01/29/22  9:43 PM  Result Value Ref Range   Color, Urine YELLOW (A) YELLOW   APPearance CLOUDY (A) CLEAR   Specific Gravity, Urine 1.026 1.005 - 1.030   pH 6.0 5.0 - 8.0   Glucose, UA NEGATIVE NEGATIVE mg/dL   Hgb urine dipstick NEGATIVE NEGATIVE   Bilirubin Urine NEGATIVE NEGATIVE   Ketones, ur NEGATIVE NEGATIVE mg/dL   Protein, ur 30 (A) NEGATIVE mg/dL   Nitrite NEGATIVE NEGATIVE   Leukocytes,Ua MODERATE (A) NEGATIVE   RBC / HPF 0-5 0 - 5 RBC/hpf   WBC, UA 11-20 0 - 5 WBC/hpf   Bacteria, UA NONE SEEN NONE SEEN   Squamous Epithelial / HPF 11-20 0 - 5 /HPF   Mucus PRESENT   Urine Drug Screen, Qualitative ([facility] only)   Collection Time: 01/29/22  9:43 PM  Result Value Ref Range   Tricyclic, Ur Screen NONE DETECTED NONE DETECTED   Amphetamines, Ur Screen NONE DETECTED NONE DETECTED   MDMA (Ecstasy)Ur Screen NONE DETECTED NONE DETECTED   Cocaine Metabolite,Ur [place] NONE DETECTED NONE DETECTED   Opiate, Ur Screen NONE DETECTED NONE DETECTED   Phencyclidine (PCP) Ur S NONE DETECTED NONE DETECTED   Cannabinoid 50 Ng, Ur [place] NONE DETECTED NONE DETECTED   Barbiturates, Ur Screen NONE DETECTED NONE DETECTED   Benzodiazepine, Ur Scrn NONE DETECTED NONE DETECTED   Methadone Scn, Ur POSITIVE (A) NONE DETECTED  Wet prep, genital   Collection  Time: 01/29/22  9:59 PM  Result Value Ref Range   Yeast Wet Prep HPF POC NONE SEEN NONE SEEN   Trich, Wet Prep NONE SEEN NONE SEEN   Clue Cells Wet Prep HPF POC PRESENT (A) NONE SEEN   WBC, Wet Prep HPF POC <10 <10   Sperm NONE SEEN      Constitutional: NAD, AAOx3  HE/ENT: extraocular movements grossly intact, moist mucous membranes CV: RRR PULM: nl respiratory effort, CTABL Abd: gravid, non-tender, non-distended, soft  Ext: Non-tender, Nonedmeatous Psych: mood appropriate, speech normal Pelvic : deferred SVE:     Fetal Monitor: Baseline: 130 bpm Variability: moderate Accels: Present Decels: none Toco: none  Category: I   Assessment: 37 y.o. Unknown here for antenatal surveillance during pregnancy.  Principle diagnosis:  The encounter diagnosis was Preterm uterine contractions, antepartum, third trimester.   Plan: Labor: not present.  Fetal Wellbeing: Reassuring Cat 1 tracing. Reactive NST  Wet Prep-Pos for clue cells Prescription for flagyl sent to pharmacy D/c home stable, precautions reviewed, follow-up as scheduled.   ----- Avelino Leeds, CNM Certified Nurse Midwife [facility]

## 2022-01-29 NOTE — Progress Notes (Signed)
Patient called answering service with c/o uterine contractions about every 10 mins. Patient is 31.[redacted] weeks pregnant. Patient encouraged to go to labor and delivery.  Theresa Dohrman CNM

## 2022-01-30 DIAGNOSIS — O4703 False labor before 37 completed weeks of gestation, third trimester: Secondary | ICD-10-CM | POA: Diagnosis not present

## 2022-01-30 NOTE — OB Triage Note (Signed)
Megan Hopkins 38 y.o. presents to Labor & Delivery triage via wheelchair steered by ED staff reporting abdominal pain. She is a G3P1011 at [redacted]w[redacted]d . She denies signs and symptoms consistent with rupture of membranes or active vaginal bleeding. She complains of contractions,but states positive fetal movement. External FM and TOCO applied to non-tender abdomen. Initial FHR 135. Vital signs obtained and within normal limits. Patient oriented to care environment including call bell and bed control use. Avelino Leeds, CNM notified of patient's arrival. Plan to Monitor and await lab orders.

## 2022-03-02 ENCOUNTER — Encounter: Payer: Self-pay | Admitting: Obstetrics and Gynecology

## 2022-03-02 ENCOUNTER — Observation Stay
Admission: EM | Admit: 2022-03-02 | Discharge: 2022-03-02 | Disposition: A | Discharge status: Home / Self Care | Payer: Medicaid Other | Attending: Obstetrics and Gynecology | Admitting: Obstetrics and Gynecology

## 2022-03-02 ENCOUNTER — Other Ambulatory Visit: Payer: Self-pay

## 2022-03-02 DIAGNOSIS — F17210 Nicotine dependence, cigarettes, uncomplicated: Secondary | ICD-10-CM | POA: Insufficient documentation

## 2022-03-02 DIAGNOSIS — E039 Hypothyroidism, unspecified: Secondary | ICD-10-CM | POA: Insufficient documentation

## 2022-03-02 DIAGNOSIS — O0993 Supervision of high risk pregnancy, unspecified, third trimester: Principal | ICD-10-CM | POA: Insufficient documentation

## 2022-03-02 DIAGNOSIS — O99333 Smoking (tobacco) complicating pregnancy, third trimester: Secondary | ICD-10-CM | POA: Diagnosis not present

## 2022-03-02 DIAGNOSIS — O288 Other abnormal findings on antenatal screening of mother: Secondary | ICD-10-CM | POA: Diagnosis present

## 2022-03-02 DIAGNOSIS — Z79899 Other long term (current) drug therapy: Secondary | ICD-10-CM | POA: Diagnosis not present

## 2022-03-02 DIAGNOSIS — Z3A36 36 weeks gestation of pregnancy: Secondary | ICD-10-CM | POA: Diagnosis not present

## 2022-03-02 DIAGNOSIS — O99283 Endocrine, nutritional and metabolic diseases complicating pregnancy, third trimester: Secondary | ICD-10-CM | POA: Diagnosis not present

## 2022-03-02 MED ORDER — ACETAMINOPHEN 325 MG PO TABS: 650.0000 mg | ORAL_TABLET | ORAL | Status: DC | PRN

## 2022-03-02 MED ORDER — PRENATAL MULTIVITAMIN CH: 1.0000 | ORAL_TABLET | Freq: Every day | ORAL | Status: DC

## 2022-03-02 MED ORDER — LACTATED RINGERS IV SOLN: 125.0000 mL/h | INTRAVENOUS | Status: DC

## 2022-03-02 MED ORDER — DOCUSATE SODIUM 100 MG PO CAPS: 100.0000 mg | ORAL_CAPSULE | Freq: Every day | ORAL | Status: DC

## 2022-03-02 MED ORDER — CALCIUM CARBONATE ANTACID 500 MG PO CHEW: 2.0000 | CHEWABLE_TABLET | ORAL | Status: DC | PRN

## 2022-03-02 NOTE — Discharge Summary (Signed)
Megan Hopkins is a 37 y.o. female. She is at 60w1dgestation. Patient's last menstrual period was 05/27/2021 (approximate). Estimated Date of Delivery: 03/29/22  [facility]  Current pregnancy complicated by:  - breech - hypothyroid - hx of drug use, currently using methadone - hx of preterm birth - hx of SAB x 3 - obesity - AMA - back surgery  Chief complaint:   She was sent over from the office for intermittent variable deceleration on the NST. She reports good fetal movement, denies contractions.  S: Resting comfortably. no CTX, no VB.no LOF,  Active fetal movement.  Denies: HA, visual changes, SOB, or RUQ/epigastric pain  Maternal Medical History:   Past Medical History:  Diagnosis Date   Anxiety    Bronchitis    hx of   Hypothyroidism    Migraine    hx of   Opioid use disorder    Pneumonia     Past Surgical History:  Procedure Laterality Date   cervical colon biospy     DILATION AND CURETTAGE OF UTERUS  2022   [place]   Procedure: DILATATION AND EVACUATION;  Surgeon: SBoykin Nearing MD;  Location: [facility] ORS;  Service: Gynecology;  Laterality: N/A;   INCISION AND DRAINAGE ABSCESS Left 05/12/2014   Procedure: INCISION AND DRAINAGE ABSCESS;  Surgeon: WMolly Maduro MD;  Location: [facility] ORS;  Service: General;  Laterality: Left;   KNEE ARTHROSCOPY     left   LAPAROSCOPIC ENDOMETRIOSIS FULGURATION      Allergies  Allergen Reactions   Other Anaphylaxis    Horseradish.   Bee Venom Hives and Swelling   Doxycycline Hives and Nausea And Vomiting   Calamine Hives   Macrobid [Nitrofurantoin Monohyd Macro] Rash    Prior to Admission medications   Medication Sig Start Date End Date Taking? Authorizing Provider  levothyroxine (SYNTHROID) 75 MCG tablet Take 75 mcg by mouth daily before breakfast.    [provider]  methadone (DOLOPHINE) 10 MG/ML  solution Take 130 mg by mouth daily.    [provider]      Social History: She  reports that she has been smoking cigarettes. She has a 2.00 pack-year smoking history. She has never used smokeless tobacco. She reports that she does not drink alcohol and does not use drugs.  Family History: family history is not on file.  no history of gyn cancers  Review of Systems: A full review of systems was performed and negative except as noted in the HPI.     O:  BP 120/68 (BP Location: Left Arm)   Pulse 76   Temp 98.7 F (37.1 C) (Oral)   Resp 18   Ht 5' 9"$  (1.753 m)   Wt 110.7 kg   LMP 05/27/2021 (Approximate)   BMI 36.03 kg/m  No results found for this or any previous visit (from the past 48 hour(s)).   Constitutional: NAD, AAOx3  HE/ENT: extraocular movements grossly intact, moist mucous membranes CV: RRR PULM: nl respiratory effort, CTABL     Abd: gravid, non-tender, non-distended, soft      Ext: Non-tender, Nonedematous   Psych: mood appropriate, speech normal Pelvic: deferred  Fetal  monitoring: Cat 1 Appropriate for GA Baseline: 125bpm Variability: moderate Accelerations: present x >2 Decelerations absent  A/P: 37 y.o. 37w1dere for antenatal surveillance for NST  Principle Diagnosis:  High risk pregnancy in third trimester, fetal heart rate  decelerations in the office, prolonged monitoring  Labor: not present.  Fetal Wellbeing: Reassuring Cat 1 tracing. Monitored her for 70-19mn and reassuring, no fetal heart rate decelerations. Reactive NST  D/c home stable, precautions reviewed, follow-up as scheduled.    DGertie Fey CNM 03/02/2022 2:36 PM

## 2022-03-02 NOTE — OB Triage Note (Signed)
Patient is a G3P1011 at 35w1dwho was sent over from office for prolonged monitoring due to decel in NST at office. Reports +FM, denies vaginal bleeding and LOF. Initial FHT 130. External monitors applied and assessing.

## 2022-03-02 NOTE — OB Triage Note (Signed)
Pt being discharged by Jayme Cloud for reactive NST. Discharge instructions discussed and reviewed w/ pt and pt acknowledged understanding. Pt knows to return w/ reduced fetal movement, consistent contractions, vaginal bleeding, or rom.

## 2022-03-07 ENCOUNTER — Encounter
Admission: EM | Disposition: A | Discharge status: Home / Self Care | Payer: Self-pay | Source: Home / Self Care | Attending: Obstetrics and Gynecology

## 2022-03-07 ENCOUNTER — Inpatient Hospital Stay
Admission: EM | Admit: 2022-03-07 | Discharge: 2022-03-10 | DRG: 787 | Disposition: A | Discharge status: Home / Self Care | Payer: Medicaid Other

## 2022-03-07 ENCOUNTER — Inpatient Hospital Stay: Payer: Medicaid Other | Admitting: Anesthesiology

## 2022-03-07 ENCOUNTER — Other Ambulatory Visit: Payer: Self-pay

## 2022-03-07 ENCOUNTER — Encounter: Payer: Self-pay | Admitting: Obstetrics and Gynecology

## 2022-03-07 DIAGNOSIS — D62 Acute posthemorrhagic anemia: Secondary | ICD-10-CM | POA: Diagnosis not present

## 2022-03-07 DIAGNOSIS — Z3A36 36 weeks gestation of pregnancy: Secondary | ICD-10-CM | POA: Diagnosis not present

## 2022-03-07 DIAGNOSIS — F17210 Nicotine dependence, cigarettes, uncomplicated: Secondary | ICD-10-CM | POA: Diagnosis present

## 2022-03-07 DIAGNOSIS — F1120 Opioid dependence, uncomplicated: Secondary | ICD-10-CM | POA: Diagnosis present

## 2022-03-07 DIAGNOSIS — O99214 Obesity complicating childbirth: Secondary | ICD-10-CM | POA: Diagnosis present

## 2022-03-07 DIAGNOSIS — O99334 Smoking (tobacco) complicating childbirth: Secondary | ICD-10-CM | POA: Diagnosis present

## 2022-03-07 DIAGNOSIS — O99284 Endocrine, nutritional and metabolic diseases complicating childbirth: Secondary | ICD-10-CM | POA: Diagnosis present

## 2022-03-07 DIAGNOSIS — O99324 Drug use complicating childbirth: Secondary | ICD-10-CM | POA: Diagnosis present

## 2022-03-07 DIAGNOSIS — O9902 Anemia complicating childbirth: Secondary | ICD-10-CM | POA: Diagnosis present

## 2022-03-07 DIAGNOSIS — E039 Hypothyroidism, unspecified: Secondary | ICD-10-CM | POA: Diagnosis present

## 2022-03-07 DIAGNOSIS — O321XX0 Maternal care for breech presentation, not applicable or unspecified: Secondary | ICD-10-CM | POA: Diagnosis present

## 2022-03-07 DIAGNOSIS — O368390 Maternal care for abnormalities of the fetal heart rate or rhythm, unspecified trimester, not applicable or unspecified: Principal | ICD-10-CM | POA: Diagnosis present

## 2022-03-07 LAB — COMPREHENSIVE METABOLIC PANEL
ALT: 15 U/L (ref 0–44)
AST: 17 U/L (ref 15–41)
Albumin: 2.9 g/dL — ABNORMAL LOW (ref 3.5–5.0)
Alkaline Phosphatase: 101 U/L (ref 38–126)
Anion gap: 8 (ref 5–15)
BUN: 9 mg/dL (ref 6–20)
CO2: 21 mmol/L — ABNORMAL LOW (ref 22–32)
Calcium: 8.5 mg/dL — ABNORMAL LOW (ref 8.9–10.3)
Chloride: 106 mmol/L (ref 98–111)
Creatinine, Ser: 0.68 mg/dL (ref 0.44–1.00)
GFR, Estimated: >60 mL/min (ref >60)
Glucose, Bld: 75 mg/dL (ref 70–99)
Potassium: 3.5 mmol/L (ref 3.5–5.1)
Sodium: 135 mmol/L (ref 135–145)
Total Bilirubin: 0.4 mg/dL (ref 0.3–1.2)
Total Protein: 6.9 g/dL (ref 6.5–8.1)

## 2022-03-07 LAB — RAPID HIV SCREEN (HIV 1/2 AB+AG)
HIV 1/2 Antibodies: NONREACTIVE
HIV-1 P24 Antigen - HIV24: NONREACTIVE

## 2022-03-07 LAB — CBC
HCT: 32.9 % — ABNORMAL LOW (ref 36.0–46.0)
Hemoglobin: 11 g/dL — ABNORMAL LOW (ref 12.0–15.0)
MCH: 28.9 pg (ref 26.0–34.0)
MCHC: 33.4 g/dL (ref 30.0–36.0)
MCV: 86.6 fL (ref 80.0–100.0)
Platelets: 226 10*3/uL (ref 150–400)
RBC: 3.8 MIL/uL — ABNORMAL LOW (ref 3.87–5.11)
RDW: 13.9 % (ref 11.5–15.5)
WBC: 9.8 10*3/uL (ref 4.0–10.5)
nRBC: 0 % (ref 0.0–0.2)

## 2022-03-07 SURGERY — Surgical Case
Anesthesia: Spinal

## 2022-03-07 MED ORDER — BUPIVACAINE IN DEXTROSE 0.75-8.25 % IT SOLN
INTRATHECAL | Status: DC | PRN
  Administered 2022-03-07: 6 mg via INTRATHECAL

## 2022-03-07 MED ORDER — METHADONE HCL 10 MG/ML PO CONC: 65.0000 mg | Freq: Every day | ORAL | Status: DC

## 2022-03-07 MED ORDER — METHADONE HCL 10 MG/ML PO CONC
110.0000 mg | Freq: Every morning | ORAL | Status: DC
  Administered 2022-03-08 – 2022-03-10 (×3): 110 mg via ORAL
  Filled 2022-03-07 (×4): qty 15

## 2022-03-07 MED ORDER — OXYCODONE-ACETAMINOPHEN 5-325 MG PO TABS
2.0000 | ORAL_TABLET | ORAL | Status: DC | PRN
  Administered 2022-03-08 – 2022-03-10 (×7): 2 via ORAL
  Filled 2022-03-07 (×7): qty 2

## 2022-03-07 MED ORDER — METHADONE HCL 10 MG/ML PO CONC: 110.0000 mg | Freq: Once | ORAL | Status: DC

## 2022-03-07 MED ORDER — SUCCINYLCHOLINE CHLORIDE 200 MG/10ML IV SOSY
PREFILLED_SYRINGE | INTRAVENOUS | Status: DC | PRN
  Administered 2022-03-07: 200 mg via INTRAVENOUS

## 2022-03-07 MED ORDER — DIBUCAINE (PERIANAL) 1 % EX OINT: 1.0000 | TOPICAL_OINTMENT | CUTANEOUS | Status: DC | PRN

## 2022-03-07 MED ORDER — NICOTINE 14 MG/24HR TD PT24
14.0000 mg | MEDICATED_PATCH | Freq: Every day | TRANSDERMAL | Status: DC
  Administered 2022-03-10: 14 mg via TRANSDERMAL
  Filled 2022-03-07: qty 1

## 2022-03-07 MED ORDER — OXYCODONE-ACETAMINOPHEN 5-325 MG PO TABS
1.0000 | ORAL_TABLET | ORAL | Status: DC | PRN
  Administered 2022-03-09 (×3): 1 via ORAL
  Filled 2022-03-07 (×5): qty 1

## 2022-03-07 MED ORDER — PHENYLEPHRINE HCL-NACL 20-0.9 MG/250ML-% IV SOLN
INTRAVENOUS | Status: AC
  Filled 2022-03-07: qty 250

## 2022-03-07 MED ORDER — ACETAMINOPHEN 500 MG PO TABS
ORAL_TABLET | ORAL | Status: AC
  Filled 2022-03-07: qty 2

## 2022-03-07 MED ORDER — LACTATED RINGERS IV SOLN: 125.0000 mL/h | INTRAVENOUS | Status: DC

## 2022-03-07 MED ORDER — SOD CITRATE-CITRIC ACID 500-334 MG/5ML PO SOLN
30.0000 mL | ORAL | Status: AC
  Administered 2022-03-07: 30 mL via ORAL

## 2022-03-07 MED ORDER — PRENATAL MULTIVITAMIN CH
1.0000 | ORAL_TABLET | Freq: Every day | ORAL | Status: DC
  Administered 2022-03-08 – 2022-03-10 (×3): 1 via ORAL
  Filled 2022-03-07 (×4): qty 1

## 2022-03-07 MED ORDER — MORPHINE SULFATE (PF) 0.5 MG/ML IJ SOLN
INTRAMUSCULAR | Status: DC | PRN
  Administered 2022-03-07: .1 mg via INTRATHECAL

## 2022-03-07 MED ORDER — LEVOTHYROXINE SODIUM 75 MCG PO TABS
75.0000 ug | ORAL_TABLET | Freq: Every day | ORAL | Status: DC
  Administered 2022-03-08 – 2022-03-10 (×3): 75 ug via ORAL
  Filled 2022-03-07 (×4): qty 1

## 2022-03-07 MED ORDER — KETOROLAC TROMETHAMINE 30 MG/ML IJ SOLN
30.0000 mg | Freq: Four times a day (QID) | INTRAMUSCULAR | Status: AC
  Administered 2022-03-08 (×2): 30 mg via INTRAVENOUS
  Filled 2022-03-07 (×2): qty 1

## 2022-03-07 MED ORDER — GABAPENTIN 300 MG PO CAPS
ORAL_CAPSULE | ORAL | Status: AC
  Administered 2022-03-07: 300 mg
  Filled 2022-03-07: qty 1

## 2022-03-07 MED ORDER — ONDANSETRON HCL 4 MG/2ML IJ SOLN: 4.0000 mg | Freq: Three times a day (TID) | INTRAMUSCULAR | Status: DC | PRN

## 2022-03-07 MED ORDER — KETOROLAC TROMETHAMINE 30 MG/ML IJ SOLN: 30.0000 mg | Freq: Four times a day (QID) | INTRAMUSCULAR | Status: DC

## 2022-03-07 MED ORDER — DIPHENHYDRAMINE HCL 25 MG PO CAPS: 25.0000 mg | ORAL_CAPSULE | Freq: Four times a day (QID) | ORAL | Status: DC | PRN

## 2022-03-07 MED ORDER — DEXMEDETOMIDINE HCL IN NACL 200 MCG/50ML IV SOLN
INTRAVENOUS | Status: DC | PRN
  Administered 2022-03-07: 8 ug via INTRAVENOUS
  Administered 2022-03-07: 12 ug via INTRAVENOUS

## 2022-03-07 MED ORDER — BUPIVACAINE HCL (PF) 0.5 % IJ SOLN
INTRAMUSCULAR | Status: DC | PRN
  Administered 2022-03-07: 10 mL

## 2022-03-07 MED ORDER — METHADONE HCL 10 MG/ML PO CONC
65.0000 mg | Freq: Once | ORAL | Status: AC
  Administered 2022-03-07: 65 mg via ORAL

## 2022-03-07 MED ORDER — ACETAMINOPHEN 325 MG PO TABS
650.0000 mg | ORAL_TABLET | Freq: Four times a day (QID) | ORAL | Status: AC
  Administered 2022-03-07 – 2022-03-08 (×3): 650 mg via ORAL
  Filled 2022-03-07 (×3): qty 2

## 2022-03-07 MED ORDER — FENTANYL CITRATE (PF) 100 MCG/2ML IJ SOLN
INTRAMUSCULAR | Status: DC | PRN
  Administered 2022-03-07: 50 ug via INTRAVENOUS

## 2022-03-07 MED ORDER — DIPHENHYDRAMINE HCL 25 MG PO CAPS: 25.0000 mg | ORAL_CAPSULE | ORAL | Status: DC | PRN

## 2022-03-07 MED ORDER — FENTANYL CITRATE (PF) 100 MCG/2ML IJ SOLN
INTRAMUSCULAR | Status: DC | PRN
  Administered 2022-03-07: 15 ug via INTRATHECAL

## 2022-03-07 MED ORDER — PROPOFOL 10 MG/ML IV BOLUS
INTRAVENOUS | Status: AC
  Filled 2022-03-07: qty 20

## 2022-03-07 MED ORDER — IBUPROFEN 600 MG PO TABS
600.0000 mg | ORAL_TABLET | Freq: Four times a day (QID) | ORAL | Status: DC
  Administered 2022-03-08 – 2022-03-10 (×9): 600 mg via ORAL
  Filled 2022-03-07 (×10): qty 1

## 2022-03-07 MED ORDER — SIMETHICONE 80 MG PO CHEW
80.0000 mg | CHEWABLE_TABLET | Freq: Three times a day (TID) | ORAL | Status: DC
  Administered 2022-03-07 – 2022-03-10 (×8): 80 mg via ORAL
  Filled 2022-03-07 (×11): qty 1

## 2022-03-07 MED ORDER — SODIUM CHLORIDE 0.9% FLUSH: 3.0000 mL | INTRAVENOUS | Status: DC | PRN

## 2022-03-07 MED ORDER — OXYCODONE HCL 5 MG PO TABS: 5.0000 mg | ORAL_TABLET | Freq: Once | ORAL | Status: DC | PRN

## 2022-03-07 MED ORDER — ONDANSETRON HCL 4 MG/2ML IJ SOLN
INTRAMUSCULAR | Status: DC | PRN
  Administered 2022-03-07: 4 mg via INTRAVENOUS

## 2022-03-07 MED ORDER — BUPIVACAINE HCL (PF) 0.5 % IJ SOLN
INTRAMUSCULAR | Status: AC
  Filled 2022-03-07: qty 10

## 2022-03-07 MED ORDER — PHENYLEPHRINE HCL-NACL 20-0.9 MG/250ML-% IV SOLN
INTRAVENOUS | Status: DC | PRN
  Administered 2022-03-07: 30 ug/min via INTRAVENOUS

## 2022-03-07 MED ORDER — SOD CITRATE-CITRIC ACID 500-334 MG/5ML PO SOLN
ORAL | Status: AC
  Filled 2022-03-07: qty 15

## 2022-03-07 MED ORDER — SENNOSIDES-DOCUSATE SODIUM 8.6-50 MG PO TABS
2.0000 | ORAL_TABLET | ORAL | Status: DC
  Administered 2022-03-08 – 2022-03-09 (×2): 2 via ORAL
  Filled 2022-03-07 (×2): qty 2

## 2022-03-07 MED ORDER — PROPOFOL 10 MG/ML IV BOLUS
INTRAVENOUS | Status: DC | PRN
  Administered 2022-03-07: 200 mg via INTRAVENOUS

## 2022-03-07 MED ORDER — FENTANYL CITRATE (PF) 100 MCG/2ML IJ SOLN: 25.0000 ug | INTRAMUSCULAR | Status: DC | PRN

## 2022-03-07 MED ORDER — OXYCODONE HCL 5 MG/5ML PO SOLN: 5.0000 mg | Freq: Once | ORAL | Status: DC | PRN

## 2022-03-07 MED ORDER — OXYTOCIN-SODIUM CHLORIDE 30-0.9 UT/500ML-% IV SOLN
INTRAVENOUS | Status: DC | PRN
  Administered 2022-03-07: 400 mL via INTRAVENOUS

## 2022-03-07 MED ORDER — DEXAMETHASONE SODIUM PHOSPHATE 10 MG/ML IJ SOLN
INTRAMUSCULAR | Status: DC | PRN
  Administered 2022-03-07: 10 mg via INTRAVENOUS

## 2022-03-07 MED ORDER — MENTHOL 3 MG MT LOZG: 1.0000 | LOZENGE | OROMUCOSAL | Status: DC | PRN

## 2022-03-07 MED ORDER — FENTANYL CITRATE (PF) 100 MCG/2ML IJ SOLN
INTRAMUSCULAR | Status: AC
  Filled 2022-03-07: qty 2

## 2022-03-07 MED ORDER — KETOROLAC TROMETHAMINE 30 MG/ML IJ SOLN: 30.0000 mg | Freq: Four times a day (QID) | INTRAMUSCULAR | Status: AC

## 2022-03-07 MED ORDER — KETOROLAC TROMETHAMINE 30 MG/ML IJ SOLN
INTRAMUSCULAR | Status: AC
  Filled 2022-03-07: qty 1

## 2022-03-07 MED ORDER — MEPERIDINE HCL 25 MG/ML IJ SOLN: 6.2500 mg | INTRAMUSCULAR | Status: DC | PRN

## 2022-03-07 MED ORDER — GABAPENTIN 300 MG PO CAPS: 300.0000 mg | ORAL_CAPSULE | Freq: Once | ORAL | Status: DC

## 2022-03-07 MED ORDER — NALOXONE HCL 4 MG/10ML IJ SOLN: 1.0000 ug/kg/h | INTRAVENOUS | Status: DC | PRN

## 2022-03-07 MED ORDER — OXYTOCIN-SODIUM CHLORIDE 30-0.9 UT/500ML-% IV SOLN
INTRAVENOUS | Status: AC
  Filled 2022-03-07: qty 500

## 2022-03-07 MED ORDER — KETOROLAC TROMETHAMINE 30 MG/ML IJ SOLN
INTRAMUSCULAR | Status: DC | PRN
  Administered 2022-03-07: 30 mg via INTRAVENOUS

## 2022-03-07 MED ORDER — LACTATED RINGERS IV SOLN: INTRAVENOUS | Status: DC

## 2022-03-07 MED ORDER — NALOXONE HCL 0.4 MG/ML IJ SOLN: 0.4000 mg | INTRAMUSCULAR | Status: DC | PRN

## 2022-03-07 MED ORDER — OXYTOCIN-SODIUM CHLORIDE 30-0.9 UT/500ML-% IV SOLN
2.5000 [IU]/h | INTRAVENOUS | Status: AC
  Administered 2022-03-07: 2.5 [IU]/h via INTRAVENOUS
  Filled 2022-03-07: qty 500

## 2022-03-07 MED ORDER — OXYCODONE HCL 5 MG PO TABS
5.0000 mg | ORAL_TABLET | ORAL | Status: AC | PRN
  Administered 2022-03-07 – 2022-03-08 (×4): 5 mg via ORAL
  Filled 2022-03-07 (×4): qty 1

## 2022-03-07 MED ORDER — WITCH HAZEL-GLYCERIN EX PADS: 1.0000 | MEDICATED_PAD | CUTANEOUS | Status: DC | PRN

## 2022-03-07 MED ORDER — DEXAMETHASONE SODIUM PHOSPHATE 10 MG/ML IJ SOLN
INTRAMUSCULAR | Status: AC
  Filled 2022-03-07: qty 1

## 2022-03-07 MED ORDER — FERROUS SULFATE 325 (65 FE) MG PO TABS
325.0000 mg | ORAL_TABLET | Freq: Two times a day (BID) | ORAL | Status: DC
  Administered 2022-03-08 – 2022-03-10 (×6): 325 mg via ORAL
  Filled 2022-03-07 (×6): qty 1

## 2022-03-07 MED ORDER — LACTATED RINGERS IV SOLN: INTRAVENOUS | Status: DC | PRN

## 2022-03-07 MED ORDER — ONDANSETRON HCL 4 MG/2ML IJ SOLN
INTRAMUSCULAR | Status: AC
  Filled 2022-03-07: qty 2

## 2022-03-07 MED ORDER — MORPHINE SULFATE (PF) 0.5 MG/ML IJ SOLN
INTRAMUSCULAR | Status: AC
  Filled 2022-03-07: qty 10

## 2022-03-07 MED ORDER — DIPHENHYDRAMINE HCL 50 MG/ML IJ SOLN: 12.5000 mg | INTRAMUSCULAR | Status: DC | PRN

## 2022-03-07 MED ORDER — CEFAZOLIN SODIUM-DEXTROSE 2-4 GM/100ML-% IV SOLN
2.0000 g | INTRAVENOUS | Status: AC
  Administered 2022-03-07: 2 g via INTRAVENOUS
  Filled 2022-03-07: qty 100

## 2022-03-07 MED ORDER — ACETAMINOPHEN 500 MG PO TABS
1000.0000 mg | ORAL_TABLET | ORAL | Status: AC
  Administered 2022-03-07: 1000 mg via ORAL

## 2022-03-07 MED ORDER — BUPIVACAINE 0.25 % ON-Q PUMP DUAL CATH 400 ML
400.0000 mL | INJECTION | Status: DC
  Filled 2022-03-07: qty 400

## 2022-03-07 MED ORDER — COCONUT OIL OIL: 1.0000 | TOPICAL_OIL | Status: DC | PRN

## 2022-03-07 MED ORDER — METHADONE HCL 10 MG/ML PO CONC
65.0000 mg | Freq: Every evening | ORAL | Status: DC
  Administered 2022-03-08 – 2022-03-10 (×3): 65 mg via ORAL
  Filled 2022-03-07 (×2): qty 10
  Filled 2022-03-07: qty 6.5

## 2022-03-07 SURGICAL SUPPLY — 35 items
CATH KIT ON-Q SILVERSOAK 5 (CATHETERS) ×2 IMPLANT
CATH KIT ON-Q SILVERSOAK 5IN (CATHETERS) ×2 IMPLANT
DERMABOND ADVANCED .7 DNX12 (GAUZE/BANDAGES/DRESSINGS) ×1 IMPLANT
DRSG OPSITE POSTOP 4X10 (GAUZE/BANDAGES/DRESSINGS) ×1 IMPLANT
DRSG OPSITE POSTOP 4X12 (GAUZE/BANDAGES/DRESSINGS) IMPLANT
DRSG TELFA 3X8 NADH STRL (GAUZE/BANDAGES/DRESSINGS) ×1 IMPLANT
ELECT CAUTERY BLADE 6.4 (BLADE) ×1 IMPLANT
ELECT REM PT RETURN 9FT ADLT (ELECTROSURGICAL) ×1
ELECTRODE REM PT RTRN 9FT ADLT (ELECTROSURGICAL) ×1 IMPLANT
GAUZE PAD ABD 8X10 STRL (GAUZE/BANDAGES/DRESSINGS) IMPLANT
GAUZE SPONGE 4X4 12PLY STRL (GAUZE/BANDAGES/DRESSINGS) ×1 IMPLANT
GAUZE SPONGE 4X4 12PLY STRL LF (GAUZE/BANDAGES/DRESSINGS) IMPLANT
GLOVE BIO SURGEON STRL SZ7 (GLOVE) ×1 IMPLANT
GLOVE SURG UNDER LTX SZ7.5 (GLOVE) ×1 IMPLANT
GOWN STRL REUS W/ TWL LRG LVL3 (GOWN DISPOSABLE) ×3 IMPLANT
GOWN STRL REUS W/TWL LRG LVL3 (GOWN DISPOSABLE) ×3
MANIFOLD NEPTUNE II (INSTRUMENTS) ×1 IMPLANT
MAT PREVALON FULL STRYKER (MISCELLANEOUS) ×1 IMPLANT
NS IRRIG 1000ML POUR BTL (IV SOLUTION) ×1 IMPLANT
PACK C SECTION AR (MISCELLANEOUS) ×1 IMPLANT
PAD OB MATERNITY 4.3X12.25 (PERSONAL CARE ITEMS) ×2 IMPLANT
PAD PREP 24X41 OB/GYN DISP (PERSONAL CARE ITEMS) ×1 IMPLANT
SCRUB CHG 4% DYNA-HEX 4OZ (MISCELLANEOUS) ×1 IMPLANT
STRIP CLOSURE SKIN 1/2X4 (GAUZE/BANDAGES/DRESSINGS) ×1 IMPLANT
SUT MNCRL 4-0 (SUTURE) ×1
SUT MNCRL 4-0 27XMFL (SUTURE) ×1
SUT PDS AB 1 TP1 96 (SUTURE) ×1 IMPLANT
SUT PLAIN GUT 0 (SUTURE) IMPLANT
SUT VIC AB 0 CTX 36 (SUTURE) ×3
SUT VIC AB 0 CTX36XBRD ANBCTRL (SUTURE) ×2 IMPLANT
SUTURE MNCRL 4-0 27XMF (SUTURE) ×1 IMPLANT
SWABSTK COMLB BENZOIN TINCTURE (MISCELLANEOUS) ×1 IMPLANT
TAPE TRANSPORE STRL 2 31045 (GAUZE/BANDAGES/DRESSINGS) IMPLANT
TRAP FLUID SMOKE EVACUATOR (MISCELLANEOUS) ×1 IMPLANT
WATER STERILE IRR 500ML POUR (IV SOLUTION) ×1 IMPLANT

## 2022-03-07 NOTE — Op Note (Signed)
Cesarean Section Operative Note    Patient Name: Megan Hopkins  MRN: [id number]  Date of Surgery: 03/07/2022   Pre-operative Diagnosis:  1) Breech presentation of fetus 2) Non-reassuring antenatal fetal status 3) intrauterine pregnancy at [redacted]w[redacted]d  Post-operative Diagnosis:  1) Breech presentation of fetus 2) Non-reassuring antenatal fetal status 3) intrauterine pregnancy at 347w6d  Procedure: Primary Low Transverse Cesarean Section via Pfannenstiel incision under general anesthesia  Surgeon: Surgeon(s) and Role:    * Will BonnetMD - Primary   Assistants: ReHassan BucklerCNM; No other capable assistant available, in surgery requiring high level assistant.  Anesthesia: general   Findings:  1) normal appearing gravid uterus, fallopian tubes, and ovaries 2) Viable female infant with weight of 2,980 grams (6 lb 9 oz), APGARs 8 and 9   Quantified Blood Loss: 590 mL  Total IV Fluids: 1,000 ml   Urine Output:  100 mL clear urine at end of procedure  Specimens: none  Complications: no complications  Disposition: PACU - hemodynamically stable.   Maternal Condition: stable   Baby condition / location:  Couplet care / Skin to Skin  Procedure Details:  The patient was seen in the Holding Room. The risks, benefits, complications, treatment options, and expected outcomes were discussed with the patient. The patient concurred with the proposed plan, giving informed consent. identified as Megan Helpernd the procedure verified as C-Section Delivery. A Time Out was held and the above information confirmed.   After induction of anesthesia, the patient was draped and prepped in the usual sterile manner. A Pfannenstiel incision was made and carried down through the subcutaneous tissue to the fascia. Fascial incision was made and extended transversely. The patient was in a good deal of pain at this point. Due to her pain level we paused the surgery.  She was put under general  anesthesia at this point without difficulty.  The fascia was separated from the underlying rectus tissue superiorly and inferiorly. The peritoneum was identified and entered. Peritoneal incision was extended longitudinally. The bladder flap was not bluntly or sharply freed from the lower uterine segment. A low transverse uterine incision was made and the hysterotomy was extended with cranial-caudal tension. Delivered from breech (frank) presentation was a 2,980 gram Living newborn infant(s) or Female with Apgar scores of 8 at one minute and 9 at five minutes. Cord ph was not sent the umbilical cord was clamped and cut cord blood was obtained for evaluation. The placenta was removed Intact and appeared normal. The uterine outline, tubes and ovaries appeared normal. The uterine incision was closed with running locked sutures of 0 Vicryl.  A second layer of the same suture was thrown in an imbricating fashion.  Hemostasis was assured.  The uterus was returned to the abdomen and the paracolic gutters were cleared of all clots and debris.  The rectus muscles were inspected and found to be hemostatic.  The On-Q catheter pumps were inserted in accordance with the manufacturer's recommendations.  The catheters were inserted approximately 4cm cephelad to the incision line, approximately 1cm apart, straddling the midline.  They were inserted to a depth of the 4th mark. They were positioned superficial to the rectus abdominus muscles and deep to the rectus fascia.    The fascia was then reapproximated with running sutures of 1-0 PDS, looped. A running subcutaneous closure was performed with 3-0 Vicryl to close dead space.  The subcuticular closure was performed using 4-0 monocryl. The skin closure was  reinforced using surgical skin glue.  The On-Q catheters were bolused with 5 mL of 0.5% marcaine plain for a total of 10 mL.  The catheters were affixed to the skin with surgical skin glue, steri-strips, and tegaderm.     The surgical assistant performed tissue retraction, assistance with suturing, and fundal pressure.  Instrument, sponge, and needle counts were correct prior the abdominal closure and were correct at the conclusion of the case.  The patient received Ancef 2 gram IV prior to skin incision (within 30 minutes). For VTE prophylaxis she was wearing SCDs throughout the case.  The assistant surgeon was a CNM due to lack of availability of another Counselling psychologist.    Signed: Will Bonnet, MD 03/07/2022 3:15 PM

## 2022-03-07 NOTE — Transfer of Care (Signed)
Immediate Anesthesia Transfer of Care Note  Patient: Megan Hopkins  Procedure(s) Performed: Procedure(s): CESAREAN SECTION  Patient Location: LDR 4  Anesthesia Type:General  Level of Consciousness: drowsy  Airway & Oxygen Therapy: Patient Spontanous Breathing and Patient connected to face mask oxygen  Post-op Assessment: Report given to RN and Post -op Vital signs reviewed and stable  Post vital signs: Reviewed and stable  Last Vitals:  Vitals:   03/07/22 1104 03/07/22 1536  BP: 136/64 (!) 156/47  Pulse: 80   Resp: 20 20  Temp: 36.6 C   SpO2:  [id number]    Complications: No apparent anesthesia complications

## 2022-03-07 NOTE — Anesthesia Procedure Notes (Signed)
Procedure Name: Intubation Date/Time: 03/07/2022 2:30 PM  Performed by: Doreen Salvage, CRNAPre-anesthesia Checklist: Patient identified, Emergency Drugs available, Suction available and Patient being monitored Patient Re-evaluated:Patient Re-evaluated prior to induction Oxygen Delivery Method: Circle system utilized Preoxygenation: Pre-oxygenation with 100% oxygen Induction Type: IV induction, Cricoid Pressure applied and Rapid sequence Ventilation: Mask ventilation without difficulty Laryngoscope Size: Mac and 3 Grade View: Grade II Tube type: Oral Tube size: 6.5 mm Number of attempts: 1 Airway Equipment and Method: Stylet Placement Confirmation: ETT inserted through vocal cords under direct vision, positive ETCO2 and breath sounds checked- equal and bilateral Secured at: 20 cm Tube secured with: Tape Dental Injury: Teeth and Oropharynx as per pre-operative assessment

## 2022-03-07 NOTE — Anesthesia Procedure Notes (Signed)
Spinal  Patient location during procedure: OR Start time: 03/07/2022 2:00 PM End time: 03/07/2022 2:05 PM Reason for block: surgical anesthesia Staffing Performed: anesthesiologist  Anesthesiologist: Katera Rybka, Precious Haws, MD Performed by: Andria Frames, MD Authorized by: Andria Frames, MD   Preanesthetic Checklist Completed: patient identified, IV checked, site marked, risks and benefits discussed, surgical consent, monitors and equipment checked, pre-op evaluation and timeout performed Spinal Block Patient position: sitting Prep: ChloraPrep Patient monitoring: heart rate, continuous pulse ox, blood pressure and cardiac monitor Approach: midline Location: L3-4 Injection technique: single-shot Needle Needle type: Whitacre and Introducer  Needle gauge: 24 G Needle length: 9 cm Assessment Sensory level: T5. Events: CSF return Additional Notes Sterile aseptic technique used throughout the procedure.  Negative paresthesia. Negative blood return. Positive free-flowing CSF. Expiration date of kit checked and confirmed. Patient tolerated procedure well, without complications.

## 2022-03-07 NOTE — Anesthesia Preprocedure Evaluation (Signed)
Anesthesia Evaluation  Patient identified by MRN, date of birth, ID band Patient awake    Reviewed: Allergy & Precautions, NPO status , Patient's Chart, lab work & pertinent test results  History of Anesthesia Complications Negative for: history of anesthetic complications  Airway Mallampati: III  TM Distance: >3 FB Neck ROM: full    Dental  (+) Chipped   Pulmonary neg shortness of breath, Current Smoker and Patient abstained from smoking.   Pulmonary exam normal        Cardiovascular Exercise Tolerance: Good (-) angina (-) Past MI negative cardio ROS Normal cardiovascular exam     Neuro/Psych  Headaches  negative psych ROS   GI/Hepatic negative GI ROS, Neg liver ROS,neg GERD  ,,  Endo/Other  Hypothyroidism    Renal/GU      Musculoskeletal   Abdominal   Peds  Hematology negative hematology ROS (+)   Anesthesia Other Findings Past Medical History: No date: Anxiety No date: Bronchitis     Comment:  hx of No date: Hypothyroidism No date: Migraine     Comment:  hx of No date: Opioid use disorder No date: Pneumonia  Past Surgical History: No date: cervical colon biospy 2022: DILATION AND CURETTAGE OF UTERUS     Comment:  missed Ab, performed in [facility] 02/15/2021: [place]; N/A     Comment:  Procedure: DILATATION AND EVACUATION;  Surgeon:               Boykin Nearing, MD;  Location: [facility] ORS;                Service: Gynecology;  Laterality: N/A; 05/12/2014: INCISION AND DRAINAGE ABSCESS; Left     Comment:  Procedure: INCISION AND DRAINAGE ABSCESS;  Surgeon:               Molly Maduro, MD;  Location: [facility] ORS;  Service:               General;  Laterality: Left; No date: KNEE ARTHROSCOPY     Comment:  left No date: LAPAROSCOPIC ENDOMETRIOSIS FULGURATION     Reproductive/Obstetrics negative OB ROS                             Anesthesia  Physical Anesthesia Plan  ASA: 2 and emergent  Anesthesia Plan: Spinal   Post-op Pain Management:    Induction:   PONV Risk Score and Plan:   Airway Management Planned: Natural Airway and Nasal Cannula  Additional Equipment:   Intra-op Plan:   Post-operative Plan:   Informed Consent: I have reviewed the patients History and Physical, chart, labs and discussed the procedure including the risks, benefits and alternatives for the proposed anesthesia with the patient or authorized representative who has indicated his/her understanding and acceptance.     Dental Advisory Given  Plan Discussed with: Anesthesiologist, CRNA and Surgeon  Anesthesia Plan Comments: (Patient reports no bleeding problems and no anticoagulant use.  Plan for spinal with backup GA  Patient consented for risks of anesthesia including but not limited to:  - adverse reactions to medications - damage to eyes, teeth, lips or other oral mucosa - nerve damage due to positioning  - risk of bleeding, infection and or nerve damage from spinal that could lead to paralysis - risk of headache or failed spinal - damage to teeth, lips or other oral mucosa - sore throat or hoarseness - damage to heart, brain, nerves, lungs, other  parts of body or loss of life  Patient voiced understanding.)       Anesthesia Quick Evaluation

## 2022-03-07 NOTE — Discharge Summary (Signed)
Obstetrical Discharge Summary  Patient Name: Megan Hopkins DOB: 15-Feb-1985 MRN: [id number]  Date of Admission: 03/07/2022 Date of Delivery: 03/07/22 Delivered by: Glennon Mac MD Date of Discharge: 03/11/2022  Primary OB: [facility] [id number] last menstrual period was 05/27/2021 (approximate). EDC Estimated Date of Delivery: 03/29/22 Gestational Age at Delivery: [redacted]w[redacted]d  Antepartum complications:  Breech Hypothyroid- synthroid 758m daily hx of drug use, currently on methadone; split dose '110mg'$  qAM, '65mg'$  qPM hx of preterm birth at 36.6wks hx of SAB x 3 Obesity, 30.2 pre-preg AMA Back surgery 2013- anesthesia consult done 01/2022 Tobacco use   Admitting Diagnosis: Non-reassuring electronic fetal monitoring tracing [O36.8390] Breech presentation of fetus [O32.1XX0]  Secondary Diagnosis:primary low transverse cesarean  Patient Active Problem List   Diagnosis Date Noted   Non-reassuring electronic fetal monitoring tracing 03/07/2022   Breech presentation of fetus 03/07/2022   [redacted] weeks gestation of pregnancy 03/07/2022   NST (non-stress test) nonreactive 03/02/2022   Preterm uterine contractions in third trimester, antepartum 01/29/2022   Missed abortion with fetal demise before 20 completed weeks of gestation 02/15/2021   Supervision of high-risk pregnancy, third trimester 01/18/2021   Breast abscess 05/11/2014    Discharge Diagnosis: Preterm Pregnancy Delivered      Augmentation: N/A Complications: None Intrapartum complications/course:  visit for NRFHR tracing in clinic with subsequent prolonged decel while in triage; breech presentation confirmed, decision to proceed to CS, see Op note.  Delivery Type: primary cesarean section, low transverse incision Anesthesia: spinal anesthesia, general Placenta: manual removal To Pathology: No  Laceration: n/a Episiotomy: none Newborn Data: Live born female "AsMiquel DunnBirth Weight: 6 lb 9.1 oz (2980 g) APGAR: 8,  9  Newborn Delivery   Birth date/time: 03/07/2022 14:32:00 Delivery type: C-Section, Low Transverse Trial of labor: No C-section categorization: Primary      Postpartum Procedures: none  Edinburgh:     03/10/2022   12:50 AM  Edinburgh Postnatal Depression Scale Screening Tool  I have been able to laugh and see the funny side of things. 0  I have looked forward with enjoyment to things. 0  I have blamed myself unnecessarily when things went wrong. 2  I have been anxious or worried for no good reason. 1  I have felt scared or panicky for no good reason. 0  Things have been getting on top of me. 1  I have been so unhappy that I have had difficulty sleeping. 0  I have felt sad or miserable. 0  I have been so unhappy that I have been crying. 1  The thought of harming myself has occurred to me. 0  Edinburgh Postnatal Depression Scale Total 5     Post partum course- Cesarean Section:  Patient had an uncomplicated postpartum course.  By time of discharge on POD#3, her pain was controlled on oral pain medications; she had appropriate lochia and was ambulating, voiding without difficulty, tolerating regular diet and passing flatus.   She was deemed stable for discharge to home.    Discharge Physical Exam:  BP 109/77 (BP Location: Left Arm)   Pulse 73   Temp 97.7 F (36.5 C) (Axillary)   Resp 20   Ht '5\' 9"'$  (1.753 m)   Wt 110.7 kg   LMP 05/27/2021 (Approximate)   SpO2 98%   Breastfeeding Unknown   BMI 36.03 kg/m   General: NAD CV: RRR Pulm: CTABL, nl effort ABD: s/nd/nt, fundus firm and below the umbilicus Lochia: moderate Incision: c/d/I, covered with occlusive OP site dressing  DVT  Evaluation: LE non-ttp, no evidence of DVT on exam.  Hemoglobin  Date Value Ref Range Status  03/08/2022 9.2 (L) 12.0 - 15.0 g/dL Final   HGB  Date Value Ref Range Status  04/17/2014 11.6 (L) 12.0 - 16.0 g/dL Final   HCT  Date Value Ref Range Status  03/08/2022 27.2 (L) 36.0 - 46.0 %  Final  04/17/2014 34.8 (L) 35.0 - 47.0 % Final    Risk assessment for postpartum VTE and prophylactic treatment: Very high risk factors: None High risk factors: None Moderate risk factors: Cesarean delivery  and BMI 30-40 kg/m2  Postpartum VTE prophylaxis with LMWH not indicated  Disposition: stable, discharge to home. Baby Feeding: breast and formula feeding Baby Disposition: home with mom  Rh Immune globulin indicated: Yes: O neg/O pos Rubella vaccine given: was not indicated Varivax vaccine given: was not indicated Flu vaccine given in AP setting: Yes  Tdap vaccine given in AP setting: Yes   Contraception:  TBD  Prenatal Labs:   Blood type/Rh O Neg  Antibody screen neg  Rubella Immune  Varicella Immune  RPR NR  HBsAg Neg  HIV NR  GC neg  Chlamydia neg  Genetic screening Negative MaterniT21 and AFP  1 hour GTT  101  3 hour GTT    GBS  Neg     Plan:  ARNISSA SILKWOOD was discharged to home in good condition. Follow-up appointment with delivering provider in 1 week.  Discharge Medications: Allergies as of 03/10/2022       Reactions   Other Anaphylaxis   Horseradish.   Bee Venom Hives, Swelling   Doxycycline Hives, Nausea And Vomiting   Calamine Hives   Macrobid [nitrofurantoin Monohyd Macro] Rash        Medication List     STOP taking these medications    acetaminophen 325 MG tablet Commonly known as: TYLENOL       TAKE these medications    gabapentin 300 MG capsule Commonly known as: NEURONTIN Take 1 capsule (300 mg total) by mouth 2 (two) times daily for 5 days.   ibuprofen 600 MG tablet Commonly known as: ADVIL Take 1 tablet (600 mg total) by mouth every 6 (six) hours as needed for mild pain or cramping.   levothyroxine 75 MCG tablet Commonly known as: SYNTHROID Take 75 mcg by mouth daily before breakfast.   methadone 10 MG/ML solution Commonly known as: DOLOPHINE Take 130 mg by mouth daily.   oxyCODONE-acetaminophen 5-325 MG  tablet Commonly known as: PERCOCET/ROXICET Take 1-2 tablets by mouth every 4 (four) hours as needed for up to 7 days for severe pain (pain score >7/10).         Follow-up Information     Will Bonnet, MD. Go on 03/15/2022.   Specialty: Obstetrics and Gynecology Why: postop visit with Dr Glennon Mac at 9:15 am on 03/15/2022. Contact information: [place] [address] [phone]                 Signed: Minda Meo, CNM 7:24 AM 03/11/2022  Drinda Butts, CNM Certified Nurse Midwife [facility]

## 2022-03-07 NOTE — H&P (Addendum)
OB History & Physical   History of Present Illness:  Chief Complaint:   HPI:  Megan Hopkins is a 37 y.o. G33P1011 female at 75w6ddated by [place] 161w1dEDD 03/29/22.  She presents to L&D for fetal monitoring due to decel in office.  Active FM; denies LOF or VB, having irreg UCs.     Pregnancy Issues: Breech Hypothyroid- synthroid 7510mdaily hx of drug use, currently on methadone; split dose '110mg'$  qAM, '65mg'$  qPM hx of preterm birth at 36.6wks hx of SAB x 3 Obesity, 30.2 pre-preg AMA Back surgery 2013- anesthesia consult done 01/2022 Tobacco use    Maternal Medical History:   Past Medical History:  Diagnosis Date   Anxiety    Bronchitis    hx of   Hypothyroidism    Migraine    hx of   Opioid use disorder    Pneumonia     Past Surgical History:  Procedure Laterality Date   cervical colon biospy     DILATION AND CURETTAGE OF UTERUS  2022   [place]   Procedure: DILATATION AND EVACUATION;  Surgeon: SchBoykin NearingD;  Location: [facility] ORS;  Service: Gynecology;  Laterality: N/A;   INCISION AND DRAINAGE ABSCESS Left 05/12/2014   Procedure: INCISION AND DRAINAGE ABSCESS;  Surgeon: WilMolly MaduroD;  Location: [facility] ORS;  Service: General;  Laterality: Left;   KNEE ARTHROSCOPY     left   LAPAROSCOPIC ENDOMETRIOSIS FULGURATION      Allergies  Allergen Reactions   Other Anaphylaxis    Horseradish.   Bee Venom Hives and Swelling   Doxycycline Hives and Nausea And Vomiting   Calamine Hives   Macrobid [Nitrofurantoin Monohyd Macro] Rash    Prior to Admission medications   Medication Sig Start Date End Date Taking? Authorizing Provider  acetaminophen (TYLENOL) 325 MG tablet Take 650 mg by mouth every 6 (six) hours as needed for mild pain.   Yes [provider]  levothyroxine (SYNTHROID) 75 MCG tablet Take 75 mcg by mouth daily before breakfast.   Yes [provider]   methadone (DOLOPHINE) 10 MG/ML solution Take 130 mg by mouth daily.   Yes [provider]     [place]: She  reports that she has been smoking cigarettes. She has a 2.00 pack-year smoking history. She has never used smokeless tobacco. She reports that she does not drink alcohol and does not use drugs.  Family History: family history is not on file.   Review of Systems: A full review of systems was performed and negative except as noted in the HPI.     Physical Exam:  Vital Signs: BP 136/64 (BP Location: Left Arm)   Pulse 80   Temp 97.9 F (36.6 C) (Oral)   Resp 20   LMP 05/27/2021 (Approximate)  General: no acute distress.  HEENT: normocephalic, atraumatic Heart: regular rate & rhythm.  No murmurs/rubs/gallops Lungs: clear to auscultation bilaterally, normal respiratory effort Abdomen: soft, gravid, non-tender;  EFW: 7lbs Pelvic: deferred   Extremities: non-tender, symmetric, trace edema bilaterally.  DTRs: 2+  Neurologic: Alert & oriented x 3.    Results for orders placed or performed during the hospital encounter of 03/07/22 (from the past 24 hour(s))  CBC     Status: Abnormal   Collection Time: 03/07/22 11:20 AM  Result Value Ref Range   WBC 9.8 4.0 - 10.5 K/uL  RBC 3.80 (L) 3.87 - 5.11 MIL/uL   Hemoglobin 11.0 (L) 12.0 - 15.0 g/dL   HCT 32.9 (L) 36.0 - 46.0 %   MCV 86.6 80.0 - 100.0 fL   MCH 28.9 26.0 - 34.0 pg   MCHC 33.4 30.0 - 36.0 g/dL   RDW 13.9 11.5 - 15.5 %   Platelets 226 150 - 400 K/uL   nRBC 0.0 0.0 - 0.2 %  Rapid HIV screen (HIV 1/2 Ab+Ag)     Status: None   Collection Time: 03/07/22 11:20 AM  Result Value Ref Range   HIV-1 P24 Antigen - HIV24 NON REACTIVE NON REACTIVE   HIV 1/2 Antibodies NON REACTIVE NON REACTIVE   Interpretation (HIV Ag Ab)      A non reactive test result means that HIV 1 or HIV 2 antibodies and HIV 1 p24 antigen were not detected in the specimen.    Pertinent Results:   Prenatal Labs: Blood type/Rh O Neg  Antibody screen neg  Rubella Immune  Varicella Immune  RPR NR  HBsAg Neg  HIV NR  GC neg  Chlamydia neg  Genetic screening Negative MaterniT21 and AFP  1 hour GTT  101  3 hour GTT   GBS  Neg   FHT: Cat II tracing - 130bpm, mod var, + accels, no decels currently - Prolonged decel x 48mn, nadir to 60bpm, resolution with maternal position changes.  TOCO: Irreg UCs    Bedside AFI performed:  Presentation: Breech 5.31cm 5.5cm 2.64cm No measurable pocket due to cord Total: 13.45cm   No results found.  Assessment:  Megan GAUDIOis a 37y.o. GG56P1011female at 370w6dith Opioid Use disorder, Breech presentation and non-reassuring FHR monitoring.   Plan:  1. Admit to Labor & Delivery; consents reviewed and obtained - reviewed with SDJ, will proceed with CS today.   2. Fetal Well being  - Fetal Tracing: Cat II - Group B Streptococcus ppx indicated: Neg - Presentation: breech confirmed by [place] 3. Routine OB: - Prenatal labs reviewed, as above - Rh O Neg - CBC, T&S, RPR on admit - NPO, IVF  4. Post Partum Planning: - Infant feeding: breast and formula - Contraception: TBD - Tdap 01/06/22 - Flu 12/2021  ReNelsonCNM 03/07/22 12:50 PM  Patient initially evaluated in clinic with a category 2 tracing. Sent to L&D for further assessment. She had a 5 minute deceleration (appeared to be a late deceleration) with a period of minimal variability upon recovery of baseline.  Discussed recommendation for delivery. Verification of breech presentation by bedside u/s.  Patient agrees to go for primary c-section.  Discussed with Dr. PiTheodore Demarkf anesthesia who agrees to proceed as soon as we can to avoid an emergency situation.    StPrentice DockerMD, FABelk ClinicB/GYN 03/07/2022 1:07 PM

## 2022-03-08 ENCOUNTER — Encounter: Payer: Self-pay | Admitting: Obstetrics and Gynecology

## 2022-03-08 LAB — CBC
HCT: 27.2 % — ABNORMAL LOW (ref 36.0–46.0)
Hemoglobin: 9.2 g/dL — ABNORMAL LOW (ref 12.0–15.0)
MCH: 29.6 pg (ref 26.0–34.0)
MCHC: 33.8 g/dL (ref 30.0–36.0)
MCV: 87.5 fL (ref 80.0–100.0)
Platelets: 210 10*3/uL (ref 150–400)
RBC: 3.11 MIL/uL — ABNORMAL LOW (ref 3.87–5.11)
RDW: 13.8 % (ref 11.5–15.5)
WBC: 20.5 10*3/uL — ABNORMAL HIGH (ref 4.0–10.5)
nRBC: 0 % (ref 0.0–0.2)

## 2022-03-08 LAB — TYPE AND SCREEN
ABO/RH(D): O NEG
Antibody Screen: POSITIVE

## 2022-03-08 LAB — RPR: RPR Ser Ql: NONREACTIVE

## 2022-03-08 LAB — FETAL SCREEN: Fetal Screen: NEGATIVE

## 2022-03-08 MED ORDER — SIMETHICONE 80 MG PO CHEW
80.0000 mg | CHEWABLE_TABLET | ORAL | Status: DC | PRN
  Administered 2022-03-10: 80 mg via ORAL

## 2022-03-08 MED ORDER — RHO D IMMUNE GLOBULIN 1500 UNIT/2ML IJ SOSY
300.0000 ug | PREFILLED_SYRINGE | Freq: Once | INTRAMUSCULAR | Status: AC
  Administered 2022-03-08: 300 ug via INTRAVENOUS
  Filled 2022-03-08: qty 2

## 2022-03-08 MED ORDER — SODIUM CHLORIDE 0.9 % IV SOLN: INTRAVENOUS | Status: DC | PRN

## 2022-03-08 MED ORDER — SODIUM CHLORIDE 0.9 % IV SOLN
300.0000 mg | INTRAVENOUS | Status: DC
  Administered 2022-03-08: 300 mg via INTRAVENOUS
  Filled 2022-03-08: qty 300

## 2022-03-08 MED ORDER — GABAPENTIN 300 MG PO CAPS
300.0000 mg | ORAL_CAPSULE | Freq: Every day | ORAL | Status: DC
  Administered 2022-03-08: 300 mg via ORAL
  Filled 2022-03-08: qty 1

## 2022-03-08 NOTE — Anesthesia Postprocedure Evaluation (Signed)
Anesthesia Post Note  Patient: Megan Hopkins  Procedure(s) Performed: [place]  Patient location during evaluation: Mother Baby Anesthesia Type: Spinal and General Level of consciousness: awake and alert Pain management: pain level controlled Vital Signs Assessment: post-procedure vital signs reviewed and stable Respiratory status: spontaneous breathing, nonlabored ventilation and respiratory function stable Cardiovascular status: blood pressure returned to baseline and stable Postop Assessment: no apparent nausea or vomiting, patient able to bend at knees and able to ambulate Anesthetic complications: no   No notable events documented.   Last Vitals:  Vitals:   03/08/22 0010 03/08/22 0319  BP: 127/78 115/75  Pulse: 82 78  Resp:  19  Temp:  36.9 C  SpO2: 99% 99%    Last Pain:  Vitals:   03/08/22 0318  TempSrc:   PainSc: 5                  Precious Haws Adelheid Hoggard

## 2022-03-08 NOTE — Lactation Note (Signed)
This note was copied from a baby's chart. Lactation Consultation Note  Patient Name: Megan Hopkins [id number] Date: 03/08/2022 Age:37 hours Reason for consult: Initial assessment;Late-preterm 34-36.6wks;NICU baby   Maternal Data This is mom's 2nd baby, Primary C/S for malpresentation. Mom with history of drug use on methadone, hypothyroidism, obesity, anxiety, and tobacco use. Baby LPT 36 6/7 weeks in SCN .  On initial visit mom reports she is concerned because she "isn't getting hardly anything" when she pumps and when she had started pumping with her previous child the milk was "pouring out." Mom would like to breastfeed this baby. Does the patient have breastfeeding experience prior to this delivery?: No (per mom she wanted to BF her last child who is now 61 yo but per mom was told not to BF because she was on methadone.)  Feeding Mother's Current Feeding Choice: Breast Milk and Formula Nipple Type: Dr. Clement Husbands   Lactation Tools Discussed/Used Tools: Pump Breast pump type: Double-Electric Breast Pump Pump Education: Setup, frequency, and cleaning;Milk Storage Reason for Pumping: Baby in SCN LPT Pumping frequency: Recommended 8 times/24 hours. Pumped volume:  (Mom reports one time she had some drops and was concerned that she is not getting measureable amounts.)  Interventions Interventions: DEBP;Education Reviewed with mom how the body knows to make and the importance of consistent pumping. Discussed with mom it is normal to pump and get little or nothing in the first few days. Mom has her own pump at bedside .Recommended mom use the [employer] hospital grade pump at while she is here. Encouraged mom to pump 8 times/24 hours.   Discharge Pump: Personal  Consult Status Consult Status: Follow-up Date: 03/09/22  Update provided to care nurse.  Jonna Garwood Wentzell 03/08/2022, 6:56 PM

## 2022-03-08 NOTE — Progress Notes (Signed)
Post Partum Day 1 Subjective: Doing well, no complaints.  Tolerating regular diet, pain with PO meds, voiding and ambulating without difficulty.  No CP SOB Fever,Chills, N/V or leg pain; denies nipple or breast pain, no HA change of vision, RUQ/epigastric pain  Objective: BP (!) 103/55 (BP Location: Right Arm)   Pulse 77   Temp 98.5 F (36.9 C) (Oral)   Resp 20   Ht '5\' 9"'$  (1.753 m)   Wt 110.7 kg   LMP 05/27/2021 (Approximate)   SpO2 99%   Breastfeeding Unknown   BMI 36.03 kg/m    Physical Exam:  General: NAD Breasts: soft/nontender CV: RRR Pulm: nl effort, CTABL Abdomen: soft, NT, BS x 4 Incision: Dsg CDI/Honeycomb intact/no erythema or drainage, OnQ pump in place Lochia: moderate Uterine Fundus: fundus firm and 1 fb below umbilicus DVT Evaluation: no cords, ttp LEs   Recent Labs    03/07/22 1120 03/08/22 0541  HGB 11.0* 9.2*  HCT 32.9* 27.2*  WBC 9.8 20.5*  PLT 226 210    Assessment/Plan: 37 y.o. [id number] postpartum day # 1  - Continue routine PP care - Lactation consult PRN - Discussed contraceptive options including implant, IUDs hormonal and non-hormonal, injection, pills/ring/patch, condoms, and NFP.  - Acute blood loss anemia - hemodynamically stable and asymptomatic; start po ferrous sulfate BID with stool softeners. IV Venofer ordered.  Disposition: Does not desire Dc home today.   Gertie Fey, CNM 03/08/2022 9:33 AM

## 2022-03-08 NOTE — Progress Notes (Signed)
Pharmacy verification of Methadone Dose  [facility] in [place] was contacted to verify Methadone dose [phone]). Spoke with Sande Rives LPN. Patient's current dosing is: Methadone 110 mg daily in the morning and  Methadone 65 mg in the evening as of 03/01/22.  Chinita Greenland PharmD Clinical Pharmacist 03/08/2022

## 2022-03-09 LAB — RHOGAM INJECTION: Unit division: 0

## 2022-03-09 MED ORDER — GABAPENTIN 300 MG PO CAPS
300.0000 mg | ORAL_CAPSULE | Freq: Every day | ORAL | Status: DC
  Administered 2022-03-09: 300 mg via ORAL
  Filled 2022-03-09: qty 1

## 2022-03-09 NOTE — Progress Notes (Signed)
Post Partum Day 2  Subjective: Doing well, no complaints.  Tolerating regular diet, pain with PO meds, voiding and ambulating without difficulty.  No CP SOB Fever,Chills, N/V or leg pain; denies nipple or breast pain, no HA change of vision, RUQ/epigastric pain  Objective: BP 116/63 (BP Location: Left Arm)   Pulse 81   Temp 98.6 F (37 C) (Oral)   Resp 19   Ht '5\' 9"'$  (1.753 m)   Wt 110.7 kg   LMP 05/27/2021 (Approximate)   SpO2 98%   Breastfeeding Unknown   BMI 36.03 kg/m    Physical Exam:  General: NAD Breasts: soft/nontender CV: RRR Pulm: nl effort Abdomen: soft, NT Incision: Dsg CDI/Honeycomb intact/no erythema or drainage, OnQ pump in place Lochia: appropriate Uterine Fundus: firm DVT Evaluation: No evidence of DVT seen on physical exam.  Recent Labs    03/07/22 1120 03/08/22 0541  HGB 11.0* 9.2*  HCT 32.9* 27.2*  WBC 9.8 20.5*  PLT 226 210     Assessment/Plan: 37 y.o. [id number] postpartum day # 1  - Continue routine PP care - Lactation consult PRN - Acute blood loss anemia - hemodynamically stable and asymptomatic; start po ferrous sulfate BID with stool softeners. IV Venofer given yesterdays.  Disposition: Does not desire Dc home today.   Clydene Laming, CNM 03/09/2022 8:44 AM

## 2022-03-09 NOTE — Lactation Note (Addendum)
This note was copied from a baby's chart. Lactation Consultation Note  Patient Name: Megan Hopkins [id number] Date: 03/09/2022 Age:37 hours Reason for consult: Follow-up assessment;Late-preterm 34-36.6wks;NICU baby   Maternal Data This is mom's 2nd baby, Primary C/S for malpresentation. Mom with history of drug use on methadone, hypothyroidism, obesity, anxiety, and tobacco use. Baby LPT 36 6/7 weeks in SCN .  Today on follow-up mom reports she had a small amount of colostrum from her right breast and a few drops with some blood tinge from her left breast. Mom reports with her previous child she had a breast infection and developed a breast abscess. She is questioning will her left breast produce enough milk. Per mom she did turn the pump suction up higher and it was somewhat uncomfortable but she was trying to pump more milk. Mom with questions about  drinking caffeine(cola, tea) and smoking 5 tobacco cigarettes a day while breastfeeding. Has patient been taught Hand Expression?: Yes Does the patient have breastfeeding experience prior to this delivery?: No  Feeding Mother's Current Feeding Choice: Breast Milk and Formula   Lactation Tools Discussed/Used Tools: Pump Breast pump type: Double-Electric Breast Pump Reason for Pumping: Baby in SCN and is preterm Pumping frequency: goal 8 times/24 hours Pumped volume:  (Mom is beginning to get small amounts from her right breast. Per mom she got a few drops from her left breast that had a slight blood tige to it.)  Interventions Interventions: Education Provided mom tips and strategies to maximize milk production. Reviewed insuring she chooses the suction to comfort and that more suction will not give you more milk if she is experiencing pain.  Provided information regarding caffeine consumption and smoking tobacco products while breastfeeding. Encouraged mom to try to minimize smoking and avoid exposing baby to second hand  smoke.  Discharge Pump: [place] (Recommended mom get hospital grade pump from [facility] to establish her milk supply .) WIC Program: Yes  Consult Status Consult Status: Follow-up Date: 03/10/22 Follow-up type: In-patient  Update provided to care nurse.  Jonna Rozanna Cormany 03/09/2022, 7:24 PM

## 2022-03-10 MED ORDER — IBUPROFEN 600 MG PO TABS: 600.0000 mg | ORAL_TABLET | Freq: Four times a day (QID) | ORAL | 1 refills | Status: AC | PRN

## 2022-03-10 MED ORDER — GABAPENTIN 300 MG PO CAPS: 300.0000 mg | ORAL_CAPSULE | Freq: Two times a day (BID) | ORAL | 0 refills | Status: AC

## 2022-03-10 MED ORDER — GABAPENTIN 300 MG PO CAPS
300.0000 mg | ORAL_CAPSULE | Freq: Two times a day (BID) | ORAL | Status: DC
  Administered 2022-03-10: 300 mg via ORAL
  Filled 2022-03-10: qty 1

## 2022-03-10 MED ORDER — OXYCODONE-ACETAMINOPHEN 5-325 MG PO TABS: 1.0000 | ORAL_TABLET | ORAL | 0 refills | Status: AC | PRN

## 2022-03-10 NOTE — Discharge Instructions (Signed)

## 2022-03-10 NOTE — Progress Notes (Signed)
Patient discharged home with family.  Discharge instructions, when to follow up, and prescriptions reviewed with patient.  Patient verbalized understanding. Patient will be escorted out by auxiliary.   

## 2022-03-10 NOTE — Clinical Social Work Maternal (Addendum)
CLINICAL SOCIAL WORK MATERNAL/CHILD NOTE  Patient Details  Name: Megan Hopkins MRN: [id number] Date of Birth: 12/05/1985  Date:  03/10/2022  Clinical Social Worker Initiating Note:  [place] Fam, [place] Date/Time: Initiated:  03/10/22/1227     Child's Name:  Megan Hopkins   Biological Parents:  Mother   Need for Interpreter:  None   Reason for Referral:   (MOB on methadone treatment)   Address:  [address] [place] [address]    Phone number:  [phone] (home)     Additional phone number:   Household Members/Support Persons (HM/SP):   Household Member/Support Person 1   HM/SP Name Relationship DOB or Age  HM/SP -1 "Nanna" patients mother    HM/SP -2        HM/SP -3        HM/SP -4        HM/SP -5        HM/SP -6        HM/SP -7        HM/SP -8          Natural Supports (not living in the home):  Parent   Professional Supports:     Employment: Unemployed   Type of Work: laid off april 2023 unemployed   Education:  [place] arranged:    Museum/gallery curator Resources:      Other Resources:  Child Support   Cultural/Religious Considerations Which May Impact Care:    Strengths:  Ability to meet basic needs     Psychotropic Medications:         Pediatrician:       Pediatrician List:   [facility]      Pediatrician Fax Number: [facility]  Risk Factors/Current Problems:   (Methadone)   Cognitive State:  Alert     Mood/Affect:  Calm     CSW Assessment:   CSW met with patient at bedside, patient has her mother there as well who reports she will be called "Nana" by newborn which is named Megan Land Darnell. Patient reports she has a 68 y/o son at home called Megan Hopkins. Patient reports address and phone number in chart are correct. She reports that the new borns father is [place], she is working on setting healthy  boundaries with him. She reports Austin's father is Hinda Lenis, Liane Comber see's his father every other weekend and they receive child support. Patient is currently unemployed, was laid off April 2023 and has been actively seeking employment opportunities.   Patient reports that she has a hx of depression and anxiety, was on sertraline for 6 months following the birth of her 7y/o son Liane Comber as she struggled with PPD and did receive therapy and counseling services.   Patient reports she currently is feeling well, has decorated the newborns room with disney characters and is excited for him to come home.   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 Reports she has been on Methadone for 8 years. She reports she was put on Methadone just prior to the birth of her eldest son , was [facility] at [facility]. Patient reports she does feel as though treatment is effective and her symptoms are managed with Methadone.   Patient has a goal to taper her methadone. Reports she visits her clinic for take  home doses every Tuesday. Reports since she was hospitalized Monday for the birth of Miquel Dunn, she has no home doses so she plans on going to the Methadone Clinic tomorrow Friday.   Patient reports she is currently on a split dose of methadone where she takes '110mg'$  in the morning and 65 mg in the evening. Patient reports she was recommended to pump to help with a natural taper for her newborn son. Patient acknowledges her son had a difficult night lat night where he was started on a low dose of morphine due to being rigid, stiff and irritable.   Per Protocol CSW must make CPS report even if medicine is prescribed.   CSW has reached out to CPS to make report, patient is aware.   Patient reports no questions or concerns at this time, she has a car seat at home that they will bring to the hospital when newborn is ready to discharge.    Consult for mother admitted with methadone treatment, newborn in  NICU with withdrawals.    CSW has made CPS report    Lindie Spruce, MSW, [place] [phone]      CSW Plan/Description:  Child Protective Service Report      Riccardo Dubin 03/10/2022, 12:32 PM

## 2022-03-11 ENCOUNTER — Ambulatory Visit: Payer: Self-pay

## 2022-03-11 NOTE — Lactation Note (Signed)
This note was copied from a baby's chart. Lactation Consultation Note  Patient Name: Megan Hopkins [id number] Date: 03/11/2022 Age:37 days Reason for consult: Follow-up assessment;Mother's request;NICU baby;Late-preterm 34-36.6wks   Maternal Data Does the patient have breastfeeding experience prior to this delivery?: No  Mom has questions about pumping.  She is currently in the process of pumping with DEBP at beside of baby in SCN, she is concerned about getting mastitis and abscess again.  She had this with her first child who she did not breastfeed or pump. Breasts are soft currently, sl firmer around areola.  She pumps less in left breast where she had the mastitis and abscess.  Informed that she may have scar tissue in the area of the abscess, but that side can still produce milk, but may be less than right breast.  Encouraged frequent pumping to remove milk to help prevent mastitis recurring.  Use ice for swelling and pain of breast tissue, ibuprofen to decrease inflammation. Use gentle massage of breasts before and during pumping.  She pumped 80cc EBM and I praised her for the amount that she pumped. Informed that she was in the beginning phase of mature milk transitioning in and this process would happen over approx 2 wks after delivery.        Feeding Mother's Current Feeding Choice: Breast Milk and Formula Nipple Type: Dr. Clement Husbands  [facility] Score                    Lactation Tools Discussed/Used Tools: Bottle;37F feeding tube / Syringe Breast pump type: Double-Electric Breast Pump Reason for Pumping: to provide EBM for baby in SCN Pumping frequency: encouraged q 3hr Pumped volume: 80 mL  Interventions Interventions: DEBP;Education Encouraged to go to [facility] today to obtain a DEBP, informed that she could get a pump today if she could get there before 1530.  She states she thought it would take 2 wks for processing, I contacted [place]  to confirm that she could get a pump  today and they affirmed this.  She just needs to bring driver license, MCD card and copy of baby's birth cert given at hospital.   Discharge Pump: DEBP;[facility] Program: Yes  Consult Status Consult Status: PRN Date: 03/11/22 Follow-up type: In-patient    Ferol Luz 03/11/2022, 1:39 PM

## 2022-03-29 ENCOUNTER — Inpatient Hospital Stay: Admit: 2022-03-29 | Payer: Self-pay
